# Patient Record
Sex: Female | Born: 1955 | Race: White | Hispanic: No | Marital: Single | Smoking: Former smoker
Health system: Southern US, Community
[De-identification: ages and names within clinical notes are randomized; demographics above are authoritative.]

## PROBLEM LIST (undated history)

## (undated) DIAGNOSIS — F32A Depression, unspecified: Secondary | ICD-10-CM

## (undated) DIAGNOSIS — F329 Major depressive disorder, single episode, unspecified: Secondary | ICD-10-CM

## (undated) DIAGNOSIS — C9210 Chronic myeloid leukemia, BCR/ABL-positive, not having achieved remission: Secondary | ICD-10-CM

## (undated) HISTORY — PX: ROTATOR CUFF REPAIR: SHX139

## (undated) HISTORY — PX: APPENDECTOMY: SHX54

## (undated) HISTORY — PX: CERVICAL SPINE SURGERY: SHX589

## (undated) HISTORY — PX: BREAST ENHANCEMENT SURGERY: SHX7

## (undated) HISTORY — PX: TOTAL SHOULDER REPLACEMENT: SUR1217

## (undated) HISTORY — PX: CARPAL TUNNEL RELEASE: SHX101

---

## 2011-07-16 DIAGNOSIS — I10 Essential (primary) hypertension: Secondary | ICD-10-CM | POA: Insufficient documentation

## 2011-07-16 DIAGNOSIS — K219 Gastro-esophageal reflux disease without esophagitis: Secondary | ICD-10-CM | POA: Insufficient documentation

## 2011-07-16 DIAGNOSIS — M549 Dorsalgia, unspecified: Secondary | ICD-10-CM | POA: Insufficient documentation

## 2011-07-23 DIAGNOSIS — F339 Major depressive disorder, recurrent, unspecified: Secondary | ICD-10-CM | POA: Insufficient documentation

## 2011-10-14 DIAGNOSIS — E039 Hypothyroidism, unspecified: Secondary | ICD-10-CM | POA: Insufficient documentation

## 2011-10-14 DIAGNOSIS — Z9884 Bariatric surgery status: Secondary | ICD-10-CM | POA: Insufficient documentation

## 2011-11-04 DIAGNOSIS — IMO0001 Reserved for inherently not codable concepts without codable children: Secondary | ICD-10-CM | POA: Insufficient documentation

## 2017-07-19 ENCOUNTER — Emergency Department: Payer: Federal, State, Local not specified - PPO

## 2017-07-19 ENCOUNTER — Other Ambulatory Visit: Payer: Self-pay

## 2017-07-19 DIAGNOSIS — K851 Biliary acute pancreatitis without necrosis or infection: Principal | ICD-10-CM | POA: Diagnosis present

## 2017-07-19 DIAGNOSIS — Z96619 Presence of unspecified artificial shoulder joint: Secondary | ICD-10-CM | POA: Diagnosis present

## 2017-07-19 DIAGNOSIS — R740 Nonspecific elevation of levels of transaminase and lactic acid dehydrogenase [LDH]: Secondary | ICD-10-CM | POA: Diagnosis present

## 2017-07-19 DIAGNOSIS — Z79899 Other long term (current) drug therapy: Secondary | ICD-10-CM

## 2017-07-19 DIAGNOSIS — K812 Acute cholecystitis with chronic cholecystitis: Secondary | ICD-10-CM | POA: Diagnosis present

## 2017-07-19 DIAGNOSIS — Z9884 Bariatric surgery status: Secondary | ICD-10-CM

## 2017-07-19 DIAGNOSIS — Z87891 Personal history of nicotine dependence: Secondary | ICD-10-CM

## 2017-07-19 DIAGNOSIS — Z856 Personal history of leukemia: Secondary | ICD-10-CM

## 2017-07-19 DIAGNOSIS — F329 Major depressive disorder, single episode, unspecified: Secondary | ICD-10-CM | POA: Diagnosis present

## 2017-07-19 DIAGNOSIS — E039 Hypothyroidism, unspecified: Secondary | ICD-10-CM | POA: Diagnosis present

## 2017-07-19 LAB — BASIC METABOLIC PANEL
ANION GAP: 11 (ref 5–15)
BUN: 10 mg/dL (ref 6–20)
CO2: 22 mmol/L (ref 22–32)
Calcium: 9.3 mg/dL (ref 8.9–10.3)
Chloride: 102 mmol/L (ref 98–111)
Creatinine, Ser: 0.83 mg/dL (ref 0.44–1.00)
Glucose, Bld: 215 mg/dL — ABNORMAL HIGH (ref 70–99)
POTASSIUM: 4.3 mmol/L (ref 3.5–5.1)
SODIUM: 135 mmol/L (ref 135–145)

## 2017-07-19 LAB — CBC
HEMATOCRIT: 40 % (ref 35.0–47.0)
HEMOGLOBIN: 14.2 g/dL (ref 12.0–16.0)
MCH: 30.7 pg (ref 26.0–34.0)
MCHC: 35.6 g/dL (ref 32.0–36.0)
MCV: 86.2 fL (ref 80.0–100.0)
Platelets: 224 10*3/uL (ref 150–440)
RBC: 4.64 MIL/uL (ref 3.80–5.20)
RDW: 13.1 % (ref 11.5–14.5)
WBC: 10 10*3/uL (ref 3.6–11.0)

## 2017-07-19 LAB — TROPONIN I

## 2017-07-19 NOTE — ED Triage Notes (Signed)
Patient reports having epigastric/chest pain since 2 pm today.  Patient also reports having dry heaves.  Patient currently takes a chemo pill daily.

## 2017-07-20 ENCOUNTER — Inpatient Hospital Stay
Admission: EM | Admit: 2017-07-20 | Discharge: 2017-07-22 | DRG: 418 | Disposition: A | Discharge status: Home / Self Care | Payer: Federal, State, Local not specified - PPO | Attending: Internal Medicine | Admitting: Internal Medicine

## 2017-07-20 ENCOUNTER — Emergency Department: Payer: Federal, State, Local not specified - PPO

## 2017-07-20 ENCOUNTER — Other Ambulatory Visit: Payer: Self-pay

## 2017-07-20 DIAGNOSIS — Z419 Encounter for procedure for purposes other than remedying health state, unspecified: Secondary | ICD-10-CM

## 2017-07-20 DIAGNOSIS — K851 Biliary acute pancreatitis without necrosis or infection: Secondary | ICD-10-CM | POA: Diagnosis present

## 2017-07-20 DIAGNOSIS — Z856 Personal history of leukemia: Secondary | ICD-10-CM | POA: Diagnosis not present

## 2017-07-20 DIAGNOSIS — Z79899 Other long term (current) drug therapy: Secondary | ICD-10-CM | POA: Diagnosis not present

## 2017-07-20 DIAGNOSIS — Z87891 Personal history of nicotine dependence: Secondary | ICD-10-CM | POA: Diagnosis not present

## 2017-07-20 DIAGNOSIS — F329 Major depressive disorder, single episode, unspecified: Secondary | ICD-10-CM | POA: Diagnosis present

## 2017-07-20 DIAGNOSIS — R109 Unspecified abdominal pain: Secondary | ICD-10-CM

## 2017-07-20 DIAGNOSIS — Z9884 Bariatric surgery status: Secondary | ICD-10-CM | POA: Diagnosis not present

## 2017-07-20 DIAGNOSIS — Z96619 Presence of unspecified artificial shoulder joint: Secondary | ICD-10-CM | POA: Diagnosis present

## 2017-07-20 DIAGNOSIS — K812 Acute cholecystitis with chronic cholecystitis: Secondary | ICD-10-CM | POA: Diagnosis present

## 2017-07-20 DIAGNOSIS — E039 Hypothyroidism, unspecified: Secondary | ICD-10-CM | POA: Diagnosis present

## 2017-07-20 DIAGNOSIS — R740 Nonspecific elevation of levels of transaminase and lactic acid dehydrogenase [LDH]: Secondary | ICD-10-CM | POA: Diagnosis present

## 2017-07-20 HISTORY — DX: Chronic myeloid leukemia, BCR/ABL-positive, not having achieved remission: C92.10

## 2017-07-20 HISTORY — DX: Depression, unspecified: F32.A

## 2017-07-20 HISTORY — DX: Major depressive disorder, single episode, unspecified: F32.9

## 2017-07-20 LAB — HEMOGLOBIN A1C
Hgb A1c MFr Bld: 5.1 % (ref 4.8–5.6)
Mean Plasma Glucose: 99.67 mg/dL

## 2017-07-20 LAB — COMPREHENSIVE METABOLIC PANEL
ALT: 66 U/L — AB (ref 0–44)
AST: 82 U/L — ABNORMAL HIGH (ref 15–41)
Albumin: 4 g/dL (ref 3.5–5.0)
Alkaline Phosphatase: 61 U/L (ref 38–126)
Anion gap: 6 (ref 5–15)
BUN: 9 mg/dL (ref 6–20)
CHLORIDE: 104 mmol/L (ref 98–111)
CO2: 28 mmol/L (ref 22–32)
CREATININE: 0.76 mg/dL (ref 0.44–1.00)
Calcium: 9.3 mg/dL (ref 8.9–10.3)
Glucose, Bld: 119 mg/dL — ABNORMAL HIGH (ref 70–99)
Potassium: 4.2 mmol/L (ref 3.5–5.1)
SODIUM: 138 mmol/L (ref 135–145)
Total Bilirubin: 1.1 mg/dL (ref 0.3–1.2)
Total Protein: 7 g/dL (ref 6.5–8.1)

## 2017-07-20 LAB — LIPASE, BLOOD
LIPASE: 161 U/L — AB (ref 11–51)
Lipase: 657 U/L — ABNORMAL HIGH (ref 11–51)

## 2017-07-20 LAB — HEPATIC FUNCTION PANEL
ALBUMIN: 4.2 g/dL (ref 3.5–5.0)
ALT: 77 U/L — ABNORMAL HIGH (ref 0–44)
AST: 138 U/L — ABNORMAL HIGH (ref 15–41)
Alkaline Phosphatase: 62 U/L (ref 38–126)
BILIRUBIN INDIRECT: 0.8 mg/dL (ref 0.3–0.9)
BILIRUBIN TOTAL: 1.3 mg/dL — AB (ref 0.3–1.2)
Bilirubin, Direct: 0.5 mg/dL — ABNORMAL HIGH (ref 0.0–0.2)
TOTAL PROTEIN: 7.1 g/dL (ref 6.5–8.1)

## 2017-07-20 LAB — ETHANOL: Alcohol, Ethyl (B): <10 mg/dL (ref <10)

## 2017-07-20 LAB — TSH: TSH: 0.564 u[IU]/mL (ref 0.350–4.500)

## 2017-07-20 MED ORDER — FAMOTIDINE 20 MG PO TABS
40.0000 mg | ORAL_TABLET | Freq: Every day | ORAL | Status: DC
  Administered 2017-07-20 – 2017-07-22 (×3): 40 mg via ORAL
  Filled 2017-07-20 (×3): qty 2

## 2017-07-20 MED ORDER — ONDANSETRON HCL 4 MG PO TABS: 4.0000 mg | ORAL_TABLET | Freq: Four times a day (QID) | ORAL | Status: DC | PRN

## 2017-07-20 MED ORDER — CYANOCOBALAMIN 1000 MCG/ML IJ SOLN
1000.0000 ug | INTRAMUSCULAR | Status: DC
  Administered 2017-07-20: 1000 ug via INTRAMUSCULAR
  Filled 2017-07-20: qty 1

## 2017-07-20 MED ORDER — ONDANSETRON HCL 4 MG/2ML IJ SOLN
4.0000 mg | Freq: Once | INTRAMUSCULAR | Status: AC
  Administered 2017-07-20: 4 mg via INTRAVENOUS
  Filled 2017-07-20: qty 2

## 2017-07-20 MED ORDER — CALCIUM CARBONATE ANTACID 500 MG PO CHEW
1250.0000 mg | CHEWABLE_TABLET | Freq: Three times a day (TID) | ORAL | Status: DC
  Administered 2017-07-20 – 2017-07-22 (×5): 1250 mg via ORAL
  Filled 2017-07-20 (×5): qty 7

## 2017-07-20 MED ORDER — MORPHINE SULFATE (PF) 2 MG/ML IV SOLN
2.0000 mg | INTRAVENOUS | Status: DC | PRN
  Administered 2017-07-20 – 2017-07-22 (×11): 2 mg via INTRAVENOUS
  Filled 2017-07-20 (×11): qty 1

## 2017-07-20 MED ORDER — BIOTIN 1000 MCG PO TABS: 1000.0000 ug | ORAL_TABLET | Freq: Three times a day (TID) | ORAL | Status: DC

## 2017-07-20 MED ORDER — MORPHINE SULFATE (PF) 4 MG/ML IV SOLN
4.0000 mg | INTRAVENOUS | Status: DC | PRN
  Administered 2017-07-20: 4 mg via INTRAVENOUS
  Filled 2017-07-20: qty 1

## 2017-07-20 MED ORDER — OXYCODONE HCL 5 MG PO TABS
10.0000 mg | ORAL_TABLET | ORAL | Status: DC | PRN
  Administered 2017-07-20 – 2017-07-22 (×7): 10 mg via ORAL
  Filled 2017-07-20 (×7): qty 2

## 2017-07-20 MED ORDER — FOLIC ACID 1 MG PO TABS
1.0000 mg | ORAL_TABLET | Freq: Every day | ORAL | Status: DC
  Administered 2017-07-20 – 2017-07-22 (×3): 1 mg via ORAL
  Filled 2017-07-20 (×3): qty 1

## 2017-07-20 MED ORDER — VARENICLINE TARTRATE 1 MG PO TABS
1.0000 mg | ORAL_TABLET | Freq: Two times a day (BID) | ORAL | Status: DC
  Administered 2017-07-21 – 2017-07-22 (×3): 1 mg via ORAL
  Filled 2017-07-20 (×6): qty 1

## 2017-07-20 MED ORDER — ESCITALOPRAM OXALATE 10 MG PO TABS
20.0000 mg | ORAL_TABLET | Freq: Every day | ORAL | Status: DC
  Administered 2017-07-20 – 2017-07-22 (×3): 20 mg via ORAL
  Filled 2017-07-20 (×3): qty 2

## 2017-07-20 MED ORDER — HEPARIN SODIUM (PORCINE) 5000 UNIT/ML IJ SOLN
5000.0000 [IU] | Freq: Three times a day (TID) | INTRAMUSCULAR | Status: DC
  Administered 2017-07-21 – 2017-07-22 (×3): 5000 [IU] via SUBCUTANEOUS
  Filled 2017-07-20 (×6): qty 1

## 2017-07-20 MED ORDER — ONDANSETRON HCL 4 MG/2ML IJ SOLN
4.0000 mg | Freq: Four times a day (QID) | INTRAMUSCULAR | Status: DC | PRN
  Administered 2017-07-20 – 2017-07-21 (×2): 4 mg via INTRAVENOUS
  Filled 2017-07-20: qty 2

## 2017-07-20 MED ORDER — DOCUSATE SODIUM 100 MG PO CAPS
100.0000 mg | ORAL_CAPSULE | Freq: Two times a day (BID) | ORAL | Status: DC
  Filled 2017-07-20 (×3): qty 1

## 2017-07-20 MED ORDER — MORPHINE SULFATE (PF) 4 MG/ML IV SOLN
4.0000 mg | Freq: Once | INTRAVENOUS | Status: AC
  Administered 2017-07-20: 4 mg via INTRAVENOUS
  Filled 2017-07-20: qty 1

## 2017-07-20 MED ORDER — MORPHINE SULFATE (PF) 2 MG/ML IV SOLN: 2.0000 mg | INTRAVENOUS | Status: DC | PRN

## 2017-07-20 MED ORDER — VITAMIN D 1000 UNITS PO TABS
1000.0000 [IU] | ORAL_TABLET | Freq: Every day | ORAL | Status: DC
  Administered 2017-07-20 – 2017-07-22 (×3): 1000 [IU] via ORAL
  Filled 2017-07-20 (×3): qty 1

## 2017-07-20 MED ORDER — SODIUM CHLORIDE 0.9 % IV SOLN
INTRAVENOUS | Status: DC
  Administered 2017-07-20 – 2017-07-22 (×6): via INTRAVENOUS

## 2017-07-20 MED ORDER — BOSUTINIB 100 MG PO TABS: 300.0000 mg | ORAL_TABLET | Freq: Every day | ORAL | Status: DC

## 2017-07-20 NOTE — Consult Note (Signed)
Vonda Antigua, MD [address] [place], [place], [place], [address] [id number], [place], [place], [place], [address] Phone: [phone]  Fax: [phone]  Consultation  Referring Provider:     Dr. Darvin Neighbours Primary Care Physician:  System, Pcp Not In Reason for Consultation:     Pancreatitis  Date of Admission:  07/20/2017 Date of Consultation:  07/20/2017         HPI:   Shelly Dunn is a 62 y.o. female who presents with abdominal pain that started yesterday around 2 PM.  Hervey Ard, with radiation to the back, intermittent.  Relieved with pain medications she received in the hospital.  Associated with nausea but no vomiting.  No previous history of abdominal pain similar to this.  Denies any alcohol use.  No previous history of pancreatitis. Denies any new medications.  Takes herbal supplements, however most of them are vitamins, such as vitamin D, vitamin B12.  Takes turmeric.  No weight loss supplements.  Lipase on admission was elevated to 657.  Bilirubin mildly elevated to 1.3.  Normal alk phos.  AST and ALT were mildly elevated as well.  Ultrasound showed distended gallbladder with a few gallstones noted to the neck.  Trace fluid in the gallbladder fossa.  Common bile duct 7 mm which was reported to be normal for age.  Past Medical History:  Diagnosis Date  . CML (chronic myelocytic leukemia) ([facility])   . Depression     Past Surgical History:  Procedure Laterality Date  . APPENDECTOMY    . BREAST ENHANCEMENT SURGERY    . CARPAL TUNNEL RELEASE    . CERVICAL SPINE SURGERY    . CESAREAN SECTION    . ROTATOR CUFF REPAIR    . TOTAL SHOULDER REPLACEMENT      Prior to Admission medications   Medication Sig Start Date End Date Taking? Authorizing Provider  Biotin 1000 MCG tablet Take 1,000 mcg by mouth 3 (three) times daily.   Yes [provider]  BOSULIF 100 MG tablet Take 300 mg by mouth daily. 07/11/17  Yes [provider]  calcium carbonate (OS-CAL  - DOSED IN MG OF ELEMENTAL CALCIUM) 1250 (500 Ca) MG tablet Take 1 tablet by mouth.   Yes [provider]  CHANTIX 1 MG tablet Take 1 mg by mouth 2 (two) times daily. 07/12/17  Yes [provider]  cholecalciferol (VITAMIN D) 1000 units tablet Take 1,000 Units by mouth daily.   Yes [provider]  cyanocobalamin (,VITAMIN B-12,) 1000 MCG/ML injection Inject 1 mL into the muscle every 30 (thirty) days. 07/12/17  Yes [provider]  escitalopram (LEXAPRO) 20 MG tablet Take 20 mg by mouth daily. 06/23/17  Yes [provider]  famotidine (PEPCID) 40 MG tablet Take 40 mg by mouth daily.  07/12/17  Yes [provider]  Flaxseed, Linseed, (FLAX SEEDS PO) Take 1 capsule by mouth daily.   Yes [provider]  folic acid (FOLVITE) 1 MG tablet Take 1 mg by mouth daily.   Yes [provider]  Oxycodone HCl 10 MG TABS Take 10 mg by mouth every 4 (four) hours as needed for pain. 06/19/17  Yes [provider]  TURMERIC PO Take 1 capsule by mouth daily.   Yes [provider]    Family History  Problem Relation Age of Onset  . Gallbladder disease Mother      Social History   Tobacco Use  . Smoking status: Former Research scientist (life sciences)  . Smokeless tobacco:  Never Used  Substance Use Topics  . Alcohol use: Yes  . Drug use: Never    Allergies as of 07/19/2017  . (No Known Allergies)    Review of Systems:    All systems reviewed and negative except where noted in HPI.   Physical Exam:  Vital signs in last 24 hours: Vitals:   07/20/17 0414 07/20/17 0430 07/20/17 0515 07/20/17 1218  BP: (!) 155/79 (!) 160/69 139/63 (!) 123/54  Pulse: 65 65 63 64  Resp: 18  18 18   Temp:   98.3 F (36.8 C) 98.5 F (36.9 C)  TempSrc:   Oral Oral  SpO2: 100% 95% 98% 94%  Weight:      Height:       Last BM Date: 07/19/17 General:   Pleasant, cooperative in NAD Head:  Normocephalic and atraumatic. Eyes:   No icterus.   Conjunctiva pink.  PERRLA. Ears:  Normal auditory acuity. Neck:  Supple; no masses or thyroidomegaly Lungs: Respirations even and unlabored. Lungs clear to auscultation bilaterally.   No wheezes, crackles, or rhonchi.  Abdomen:  Soft, nondistended, nontender. Normal bowel sounds. No appreciable masses or hepatomegaly.  No rebound or guarding.  Neurologic:  Alert and oriented x3;  grossly normal neurologically. Skin:  Intact without significant lesions or rashes. Cervical Nodes:  No significant cervical adenopathy. Psych:  Alert and cooperative. Normal affect.  LAB RESULTS: Recent Labs    07/19/17 2304  WBC 10.0  HGB 14.2  HCT 40.0  PLT 224   BMET Recent Labs    07/19/17 2304 07/20/17 0527  NA 135 138  K 4.3 4.2  CL 102 104  CO2 22 28  GLUCOSE 215* 119*  BUN 10 9  CREATININE 0.83 0.76  CALCIUM 9.3 9.3   LFT Recent Labs    07/19/17 2304 07/20/17 0527  PROT 7.1 7.0  ALBUMIN 4.2 4.0  AST 138* 82*  ALT 77* 66*  ALKPHOS 62 61  BILITOT 1.3* 1.1  BILIDIR 0.5*  --   IBILI 0.8  --    PT/INR No results for input(s): LABPROT, INR in the last 72 hours.  STUDIES: Dg Chest 2 View  Result Date: 07/19/2017 CLINICAL DATA:  Epigastric and chest pain since 2 p.m. today. EXAM: CHEST - 2 VIEW COMPARISON:  None. FINDINGS: Emphysematous hyperinflation of the lungs, upper lobe predominant without acute pulmonary consolidation, effusion or pneumothorax. Heart and mediastinal contours are normal with aortic atherosclerosis at the arch noted. ACDF of the included lower cervical spine. Surgical clip projects over the left humeral head with tunneled tendon fixation device projecting over the proximal right humeral diaphysis. Numerous surgical clips are seen in the upper abdomen. IMPRESSION: Emphysematous hyperinflation of the lungs, upper lobe predominant. No active pulmonary disease. Aortic atherosclerosis. Electronically Signed   By: Ashley Royalty M.D.   On: 07/19/2017 23:32   US Abdomen Limited Ruq  Result  Date: 07/20/2017 CLINICAL DATA:  Abdominal pain EXAM: ULTRASOUND ABDOMEN LIMITED RIGHT UPPER QUADRANT COMPARISON:  None. FINDINGS: Gallbladder: A few small gallstones measuring up to 7 mm are identified near the neck of the gallbladder. There is physiologic distention of the gallbladder without mural thickening. Trace fluid in the gallbladder fossa abutting the gallbladder is noted which can be a normal finding. Excluding the fluid, the gallbladder wall is not thickened. Where the gallbladder wall was measured includes this fluid and therefore is artificially thickened. No sonographic Murphy sign noted by sonographer. Common bile duct: Diameter: 7 mm, within normal limits for age.  Liver: No focal lesion identified. Within normal limits in parenchymal echogenicity. Portal vein is patent on color Doppler imaging with normal direction of blood flow towards the liver. IMPRESSION: Physiologically distended gallbladder with a few gallstones noted near the neck. Trace fluid in the gallbladder fossa is seen which can be a normal finding. There is no sonographic Murphy's. Findings are equivocal for acute cholecystitis. HIDA scan if clinically necessary. Electronically Signed   By: Ashley Royalty M.D.   On: 07/20/2017 01:55      Impression / Plan:   Shelly Dunn is a 62 y.o. y/o female with abdominal pain that started yesterday, admitted with pancreatitis  Likely source of pancreatitis is passed gallstones Normalization of bili today, and ultrasound showing a normal common bile bile duct, is consistent with no stones in the CBD at this time, and no indication for ERCP Abdominal pain is also improving, which is also consistent with passed gallstones  Would recommend checking triglycerides with next labs to rule out hypertriglyceridemia as source of pancreatitis as well.  Patient will need cholecystectomy to prevent future episodes, and timing of this is deferred to surgery.  Okay to start clear liquid diet  today if okay with surgery and primary team.  Advance diet to low-fat diet tomorrow if patient tolerates clear liquids well.  Continue IV fluids, LR at 200 to 250 cc an hour for the first 2 days post admission, and then decrease as patient tolerates oral diet.  Continue pain control  Thank you for involving me in the care of this patient.      LOS: 0 days   Virgel Manifold, MD  07/20/2017, 3:32 PM

## 2017-07-20 NOTE — Consult Note (Signed)
SURGICAL CONSULTATION NOTE   HISTORY OF PRESENT ILLNESS (HPI):  62 y.o. female presented to [facility] ED for evaluation of chest pain. Patient reports started with significant chest pain 2 days ago. Refers the pain radiates to the abdomen. No pain radiation to the back. She refers had nausea but no vomiting. Denies fever or chills.   At the ER patient had labs and ultrasound. The images shows stones in the gallbladder without significant changes of cholecystitis. The labs shows elevated lipase of 657 with mild elevation of bilirubin to 1.3.  Today at the room patient refers she still had pain this morning. She just received Morphine and refers it starting to work and improve the pain. Still NPO. Denies nausea.   Surgery is consulted by Dr. Despina Arias in this context for evaluation and management of gallstone pancreatitis.  PAST MEDICAL HISTORY (PMH):  Past Medical History:  Diagnosis Date  . CML (chronic myelocytic leukemia) ([facility])   . Depression      PAST SURGICAL HISTORY ([place]):  Past Surgical History:  Procedure Laterality Date  . APPENDECTOMY    . BREAST ENHANCEMENT SURGERY    . CARPAL TUNNEL RELEASE    . CERVICAL SPINE SURGERY    . CESAREAN SECTION    . ROTATOR CUFF REPAIR    . TOTAL SHOULDER REPLACEMENT       MEDICATIONS:  Prior to Admission medications   Medication Sig Start Date End Date Taking? Authorizing Provider  Biotin 1000 MCG tablet Take 1,000 mcg by mouth 3 (three) times daily.   Yes [provider]  BOSULIF 100 MG tablet Take 300 mg by mouth daily. 07/11/17  Yes [provider]  calcium carbonate (OS-CAL - DOSED IN MG OF ELEMENTAL CALCIUM) 1250 (500 Ca) MG tablet Take 1 tablet by mouth.   Yes [provider]  CHANTIX 1 MG tablet Take 1 mg by mouth 2 (two) times daily. 07/12/17  Yes [provider]  cholecalciferol (VITAMIN D) 1000 units tablet Take 1,000 Units by mouth daily.   Yes [provider]  cyanocobalamin (,VITAMIN  B-12,) 1000 MCG/ML injection Inject 1 mL into the muscle every 30 (thirty) days. 07/12/17  Yes [provider]  escitalopram (LEXAPRO) 20 MG tablet Take 20 mg by mouth daily. 06/23/17  Yes [provider]  famotidine (PEPCID) 40 MG tablet Take 40 mg by mouth daily.  07/12/17  Yes [provider]  Flaxseed, Linseed, (FLAX SEEDS PO) Take 1 capsule by mouth daily.   Yes [provider]  folic acid (FOLVITE) 1 MG tablet Take 1 mg by mouth daily.   Yes [provider]  Oxycodone HCl 10 MG TABS Take 10 mg by mouth every 4 (four) hours as needed for pain. 06/19/17  Yes [provider]  TURMERIC PO Take 1 capsule by mouth daily.   Yes [provider]     ALLERGIES:  No Known Allergies   SOCIAL HISTORY:  Social History   Socioeconomic History  . Marital status: Single    Spouse name: Not on file  . Number of children: Not on file  . Years of education: Not on file  . Highest education level: Not on file  Occupational History  . Not on file  Social Needs  . Financial resource strain: Not on file  . Food insecurity:    Worry: Not on file    Inability: Not on file  . Transportation needs:    Medical: Not on file    Non-medical: Not  on file  Tobacco Use  . Smoking status: Former Research scientist (life sciences)  . Smokeless tobacco: Never Used  Substance and Sexual Activity  . Alcohol use: Yes  . Drug use: Never  . Sexual activity: Not on file  Lifestyle  . Physical activity:    Days per week: Not on file    Minutes per session: Not on file  . Stress: Not on file  Relationships  . Social connections:    Talks on phone: Not on file    Gets together: Not on file    Attends religious service: Not on file    Active member of club or organization: Not on file    Attends meetings of clubs or organizations: Not on file    Relationship status: Not on file  . Intimate partner violence:    Fear of current or ex partner: Not on file    Emotionally abused:  Not on file    Physically abused: Not on file    Forced sexual activity: Not on file  Other Topics Concern  . Not on file  Social History Narrative  . Not on file    The patient currently resides (home / rehab facility / nursing home): Home The patient normally is (ambulatory / bedbound): Ambulatory   FAMILY HISTORY:  Family History  Problem Relation Age of Onset  . Gallbladder disease Mother      REVIEW OF SYSTEMS:  Constitutional: denies weight loss, fever, chills, or sweats  Eyes: denies any other vision changes, history of eye injury  ENT: denies sore throat, hearing problems  Respiratory: denies shortness of breath, wheezing  Cardiovascular: denies chest pain, palpitations  Gastrointestinal:  Positive for abdominal pain, Positive for nausea but no vomiting. See HPI Genitourinary: denies burning with urination or urinary frequency Musculoskeletal: denies any other joint pains or cramps  Skin: denies any other rashes or skin discolorations  Neurological: denies any other headache, dizziness, weakness  Psychiatric: denies any other depression, anxiety   All other review of systems were negative   VITAL SIGNS:  Temp:  [98.3 F (36.8 C)] 98.3 F (36.8 C) (06/30 0515) Pulse Rate:  [63-68] 63 (06/30 0515) Resp:  [16-18] 18 (06/30 0515) BP: (139-160)/(63-79) 139/63 (06/30 0515) SpO2:  [95 %-100 %] 98 % (06/30 0515) Weight:  [59 kg (130 lb)] 59 kg (130 lb) (06/29 2258)     Height: 5\' 4"  (162.6 cm) Weight: 59 kg (130 lb) BMI (Calculated): 22.3   PHYSICAL EXAM:  Constitutional:  -- Normal body habitus  -- Awake, alert, and oriented x3  Eyes:  -- Pupils equally round and reactive to light  -- No scleral icterus  Ear, nose, and throat:  -- No jugular venous distension  Pulmonary:  -- No crackles  -- Equal breath sounds bilaterally -- Breathing non-labored at rest Cardiovascular:  -- S1, S2 present  -- No pericardial rubs Gastrointestinal:  -- Abdomen soft, mild  tender on epigastric, non-distended, no guarding or rebound tenderness -- No abdominal masses appreciated, pulsatile or otherwise  Musculoskeletal and Integumentary:  -- Wounds or skin discoloration: None appreciated -- Extremities: no edema  Neurologic:  -- Motor function: intact and symmetric -- Sensation: intact and symmetric   Labs:  CBC Latest Ref Rng & Units 07/19/2017  WBC 3.6 - 11.0 K/uL 10.0  Hemoglobin 12.0 - 16.0 g/dL 14.2  Hematocrit 35.0 - 47.0 % 40.0  Platelets 150 - 440 K/uL 224   CMP Latest Ref Rng & Units 07/20/2017 07/19/2017  Glucose 70 -  99 mg/dL 119(H) 215(H)  BUN 6 - 20 mg/dL 9 10  Creatinine 0.44 - 1.00 mg/dL 0.76 0.83  Sodium 135 - 145 mmol/L 138 135  Potassium 3.5 - 5.1 mmol/L 4.2 4.3  Chloride 98 - 111 mmol/L 104 102  CO2 22 - 32 mmol/L 28 22  Calcium 8.9 - 10.3 mg/dL 9.3 9.3  Total Protein 6.5 - 8.1 g/dL 7.0 7.1  Total Bilirubin 0.3 - 1.2 mg/dL 1.1 1.3(H)  Alkaline Phos 38 - 126 U/L 61 62  AST 15 - 41 U/L 82(H) 138(H)  ALT 0 - 44 U/L 66(H) 77(H)   Lipase: 657 (07/19/17) Lipase: 161 (07/20/17)  Imaging studies:  EXAM: ULTRASOUND ABDOMEN LIMITED RIGHT UPPER QUADRANT  COMPARISON:  None.  FINDINGS: Gallbladder:  A few small gallstones measuring up to 7 mm are identified near the neck of the gallbladder. There is physiologic distention of the gallbladder without mural thickening. Trace fluid in the gallbladder fossa abutting the gallbladder is noted which can be a normal finding. Excluding the fluid, the gallbladder wall is not thickened. Where the gallbladder wall was measured includes this fluid and therefore is artificially thickened. No sonographic Murphy sign noted by sonographer.  Common bile duct:  Diameter: 7 mm, within normal limits for age.  Liver:  No focal lesion identified. Within normal limits in parenchymal echogenicity. Portal vein is patent on color Doppler imaging with normal direction of blood flow towards the  liver.  IMPRESSION: Physiologically distended gallbladder with a few gallstones noted near the neck. Trace fluid in the gallbladder fossa is seen which can be a normal finding. There is no sonographic Murphy's. Findings are equivocal for acute cholecystitis. HIDA scan if clinically necessary.  Electronically Signed   By: Ashley Royalty M.D.   On: 07/20/2017 01:55   Assessment/Plan:  62 y.o. female with gallstone pancreatitis. On labs it looks like the stone is passing with significant improvement of lipase. Patient with improved abdominal pain after Morphine. If patient continue with improving abdominal pain may start clear liquid. Will recommend cholecystectomy before discharge after normalization of lipase and abdominal pain.   Arnold Long, MD

## 2017-07-20 NOTE — H&P (Signed)
Shelly Dunn is an 62 y.o. female.   Chief Complaint: Chest pain HPI: The patient with past medical history of CML presents to the emergency department complaining of chest pain.  The patient locates the pain mid epigastric area and reports that it radiates into her chest.  She admits to mild nausea but no vomiting.  E[facility] evaluation revealed elevated lipase.  Ultrasound of the patient's right upper quadrant did not confirm a ductal stone but due to the patient's ongoing pain as well as transaminitis the emergency department staff called the hospitalist service for admission.  Past Medical History:  Diagnosis Date  . CML (chronic myelocytic leukemia) ([place])   . Depression     Past Surgical History:  Procedure Laterality Date  . TOTAL SHOULDER REPLACEMENT      Family History  Problem Relation Age of Onset  . Gallbladder disease Mother    Social History:  reports that she has quit smoking. She has never used smokeless tobacco. Her alcohol and drug histories are not on file.  Allergies: No Known Allergies  Medications Prior to Admission  Medication Sig Dispense Refill  . Biotin 1000 MCG tablet Take 1,000 mcg by mouth 3 (three) times daily.    Marland Kitchen BOSULIF 100 MG tablet Take 300 mg by mouth daily.    . calcium carbonate (OS-CAL - DOSED IN MG OF ELEMENTAL CALCIUM) 1250 (500 Ca) MG tablet Take 1 tablet by mouth.    . CHANTIX 1 MG tablet Take 1 mg by mouth 2 (two) times daily.  5  . cholecalciferol (VITAMIN D) 1000 units tablet Take 1,000 Units by mouth daily.    . cyanocobalamin (,VITAMIN B-12,) 1000 MCG/ML injection Inject 1 mL into the muscle every 30 (thirty) days.  5  . escitalopram (LEXAPRO) 20 MG tablet Take 20 mg by mouth daily.  2  . famotidine (PEPCID) 40 MG tablet Take 40 mg by mouth daily.   5  . Flaxseed, Linseed, (FLAX SEEDS PO) Take 1 capsule by mouth daily.    . folic acid (FOLVITE) 1 MG tablet Take 1 mg by mouth daily.    . Oxycodone HCl 10 MG TABS Take 10 mg by mouth  every 4 (four) hours as needed for pain.  0  . TURMERIC PO Take 1 capsule by mouth daily.      Results for orders placed or performed during the hospital encounter of 07/20/17 (from the past 48 hour(s))  Basic metabolic panel     Status: Abnormal   Collection Time: 07/19/17 11:04 PM  Result Value Ref Range   Sodium 135 135 - 145 mmol/L   Potassium 4.3 3.5 - 5.1 mmol/L   Chloride 102 98 - 111 mmol/L    Comment: Please note change in reference range.   CO2 22 22 - 32 mmol/L   Glucose, Bld 215 (H) 70 - 99 mg/dL    Comment: Please note change in reference range.   BUN 10 6 - 20 mg/dL    Comment: Please note change in reference range.   Creatinine, Ser 0.83 0.44 - 1.00 mg/dL   Calcium 9.3 8.9 - 10.3 mg/dL   GFR calc non Af Amer >60 >60 mL/min   GFR calc Af Amer >60 >60 mL/min    Comment: (NOTE) The eGFR has been calculated using the CKD EPI equation. This calculation has not been validated in all clinical situations. eGFR's persistently <60 mL/min signify possible Chronic Kidney Disease.    Anion gap 11 5 - 15  Comment: Performed at [facility], [address] [place], [place] [address]  CBC     Status: None   Collection Time: 07/19/17 11:04 PM  Result Value Ref Range   WBC 10.0 3.6 - 11.0 K/uL   RBC 4.64 3.80 - 5.20 MIL/uL   Hemoglobin 14.2 12.0 - 16.0 g/dL   HCT 40.0 35.0 - 47.0 %   MCV 86.2 80.0 - 100.0 fL   MCH 30.7 26.0 - 34.0 pg   MCHC 35.6 32.0 - 36.0 g/dL   RDW 13.1 11.5 - 14.5 %   Platelets 224 150 - 440 K/uL    Comment: Performed at [facility], [address] [place], [place] [address]  Troponin I     Status: None   Collection Time: 07/19/17 11:04 PM  Result Value Ref Range   Troponin I <0.03 <0.03 ng/mL    Comment: Performed at [facility], [address] [place], [place] [address]  Lipase, blood     Status: Abnormal   Collection Time: 07/19/17 11:04 PM  Result Value Ref Range   Lipase 657 (H) 11 - 51 U/L    Comment:  RESULT CONFIRMED BY MANUAL DILUTION.PMH Performed at [facility], [address] [place], [place] [address]   Hepatic function panel     Status: Abnormal   Collection Time: 07/19/17 11:04 PM  Result Value Ref Range   Total Protein 7.1 6.5 - 8.1 g/dL   Albumin 4.2 3.5 - 5.0 g/dL   AST 138 (H) 15 - 41 U/L   ALT 77 (H) 0 - 44 U/L    Comment: Please note change in reference range.   Alkaline Phosphatase 62 38 - 126 U/L   Total Bilirubin 1.3 (H) 0.3 - 1.2 mg/dL   Bilirubin, Direct 0.5 (H) 0.0 - 0.2 mg/dL    Comment: Please note change in reference range.   Indirect Bilirubin 0.8 0.3 - 0.9 mg/dL    Comment: Performed at [facility], [address] [place], [place] [address]  TSH     Status: None   Collection Time: 07/20/17  5:27 AM  Result Value Ref Range   TSH 0.564 0.350 - 4.500 uIU/mL    Comment: Performed by a 3rd Generation assay with a functional sensitivity of <=0.01 uIU/mL. Performed at [facility], [address] [place], [place] [address]   Ethanol     Status: None   Collection Time: 07/20/17  5:27 AM  Result Value Ref Range   Alcohol, Ethyl (B) <10 <10 mg/dL    Comment: (NOTE) Lowest detectable limit for serum alcohol is 10 mg/dL. For medical purposes only. Performed at [facility], [address] [place], [place] [address]    Dg Chest 2 View  Result Date: 07/19/2017 CLINICAL DATA:  Epigastric and chest pain since 2 p.m. today. EXAM: CHEST - 2 VIEW COMPARISON:  None. FINDINGS: Emphysematous hyperinflation of the lungs, upper lobe predominant without acute pulmonary consolidation, effusion or pneumothorax. Heart and mediastinal contours are normal with aortic atherosclerosis at the arch noted. ACDF of the included lower cervical spine. Surgical clip projects over the left humeral head with tunneled tendon fixation device projecting over the proximal right humeral diaphysis. Numerous surgical clips are seen in the upper abdomen.  IMPRESSION: Emphysematous hyperinflation of the lungs, upper lobe predominant. No active pulmonary disease. Aortic atherosclerosis. Electronically Signed   By: Ashley Royalty M.D.   On: 07/19/2017 23:32   US Abdomen Limited Ruq  Result  Date: 07/20/2017 CLINICAL DATA:  Abdominal pain EXAM: ULTRASOUND ABDOMEN LIMITED RIGHT UPPER QUADRANT COMPARISON:  None. FINDINGS: Gallbladder: A few small gallstones measuring up to 7 mm are identified near the neck of the gallbladder. There is physiologic distention of the gallbladder without mural thickening. Trace fluid in the gallbladder fossa abutting the gallbladder is noted which can be a normal finding. Excluding the fluid, the gallbladder wall is not thickened. Where the gallbladder wall was measured includes this fluid and therefore is artificially thickened. No sonographic Murphy sign noted by sonographer. Common bile duct: Diameter: 7 mm, within normal limits for age. Liver: No focal lesion identified. Within normal limits in parenchymal echogenicity. Portal vein is patent on color Doppler imaging with normal direction of blood flow towards the liver. IMPRESSION: Physiologically distended gallbladder with a few gallstones noted near the neck. Trace fluid in the gallbladder fossa is seen which can be a normal finding. There is no sonographic Murphy's. Findings are equivocal for acute cholecystitis. HIDA scan if clinically necessary. Electronically Signed   By: Ashley Royalty M.D.   On: 07/20/2017 01:55    Review of Systems  Constitutional: Negative for chills and fever.  HENT: Negative for sore throat and tinnitus.   Eyes: Negative for blurred vision and redness.  Respiratory: Negative for cough and shortness of breath.   Cardiovascular: Negative for chest pain, palpitations, orthopnea and PND.  Gastrointestinal: Positive for abdominal pain. Negative for diarrhea, nausea and vomiting.  Genitourinary: Negative for dysuria, frequency and urgency.  Musculoskeletal:  Negative for joint pain and myalgias.  Skin: Negative for rash.       No lesions  Neurological: Negative for speech change, focal weakness and weakness.  Endo/Heme/Allergies: Does not bruise/bleed easily.       No temperature intolerance  Psychiatric/Behavioral: Negative for depression and suicidal ideas.    Blood pressure 139/63, pulse 63, temperature 98.3 F (36.8 C), temperature source Oral, resp. rate 18, height 5' 4"  (1.626 m), weight 59 kg (130 lb), SpO2 98 %. Physical Exam  Vitals reviewed. Constitutional: She is oriented to person, place, and time. She appears well-developed and well-nourished. No distress.  HENT:  Head: Normocephalic and atraumatic.  Mouth/Throat: Oropharynx is clear and moist.  Eyes: Pupils are equal, round, and reactive to light. Conjunctivae and EOM are normal. No scleral icterus.  Neck: Normal range of motion. Neck supple. No JVD present. No tracheal deviation present. No thyromegaly present.  Cardiovascular: Normal rate, regular rhythm and normal heart sounds. Exam reveals no gallop and no friction rub.  No murmur heard. Respiratory: Effort normal and breath sounds normal.  GI: Soft. Bowel sounds are normal. She exhibits no distension. There is no tenderness.  Genitourinary:  Genitourinary Comments: Deferred  Musculoskeletal: Normal range of motion. She exhibits no edema.  Lymphadenopathy:    She has no cervical adenopathy.  Neurological: She is alert and oriented to person, place, and time. No cranial nerve deficit. She exhibits normal muscle tone.  Skin: Skin is warm and dry. No rash noted. No erythema.  Psychiatric: She has a normal mood and affect. Her behavior is normal. Judgment and thought content normal.     Assessment/Plan This is a 62 year old female admitted for pancreatitis. 1.  Pancreatitis: Etiology likely gallstone.  Need ERCP to confirm.  Consult gastroenterology.  Manage severe pain with IV morphine. 2.  Transaminitis: Hydrate with  intravenous fluid.  Check ethanol level 3.  CML: Cell lines normal for now.  Continue bosutinib Exline 4.  Depression: Stable; continue  Lexapro 5.  Hypothyroidism: Check TSH; continue Synthroid 6.  Tobacco abuse: Continue Chantix 7.  DVT prophylaxis: Heparin 8.  GI prophylaxis: Pepcid per home regimen  Harrie Foreman, MD 07/20/2017, 6:47 AM

## 2017-07-20 NOTE — Progress Notes (Signed)

## 2017-07-20 NOTE — ED Notes (Signed)
Pt easily awakened for VS; waiting patiently for treatment room; updated on wait time

## 2017-07-20 NOTE — ED Notes (Signed)
ED Provider at bedside. 

## 2017-07-20 NOTE — ED Notes (Signed)
Pt resting in sub wait recliner with eyes closed and even unlabored respirations

## 2017-07-20 NOTE — ED Notes (Signed)
Pt easily awakened; informed of ordered US;

## 2017-07-20 NOTE — ED Provider Notes (Signed)
[facility] Emergency Department Provider Note   ____________________________________________   First MD Initiated Contact with Patient 07/20/17 [phone]     (approximate)  I have reviewed the triage vital signs and the nursing notes.   HISTORY  Chief Complaint Chest Pain    HPI Shelly Dunn is a 62 y.o. female who comes into the hospital today with some abdominal pain.  The patient states that she has some pain below her breasts and across her upper abdomen that is been going on since about 2 PM.  The patient has a history of chronic myeloid leukemia.  She takes oxycodone every 4 hours but reports that it did not even help her pain.  She is had some nausea and vomiting and states that she would vomit of the medicines.  The patient denies any fevers.  She states that her chest and her stomach is burning.  She is had some shortness of breath and some dizziness.  She had a similar episode 7 years ago but was told that it was due to spasms of her esophagus.  The patient states though that that episode was not this bad.  She rates her pain a 7 out of 10 in intensity currently.  She is here today for evaluation.  Past Medical History:  Diagnosis Date  . CML (chronic myelocytic leukemia) ([place])   . Depression     Patient Active Problem List   Diagnosis Date Noted  . Gallstone pancreatitis 07/20/2017    Past Surgical History:  Procedure Laterality Date  . APPENDECTOMY    . BREAST ENHANCEMENT SURGERY    . CARPAL TUNNEL RELEASE    . CERVICAL SPINE SURGERY    . CESAREAN SECTION    . ROTATOR CUFF REPAIR    . TOTAL SHOULDER REPLACEMENT      Prior to Admission medications   Medication Sig Start Date End Date Taking? Authorizing Provider  Biotin 1000 MCG tablet Take 1,000 mcg by mouth 3 (three) times daily.   Yes [provider]  BOSULIF 100 MG tablet Take 300 mg by mouth daily. 07/11/17  Yes [provider]  calcium carbonate (OS-CAL - DOSED  IN MG OF ELEMENTAL CALCIUM) 1250 (500 Ca) MG tablet Take 1 tablet by mouth.   Yes [provider]  CHANTIX 1 MG tablet Take 1 mg by mouth 2 (two) times daily. 07/12/17  Yes [provider]  cholecalciferol (VITAMIN D) 1000 units tablet Take 1,000 Units by mouth daily.   Yes [provider]  cyanocobalamin (,VITAMIN B-12,) 1000 MCG/ML injection Inject 1 mL into the muscle every 30 (thirty) days. 07/12/17  Yes [provider]  escitalopram (LEXAPRO) 20 MG tablet Take 20 mg by mouth daily. 06/23/17  Yes [provider]  famotidine (PEPCID) 40 MG tablet Take 40 mg by mouth daily.  07/12/17  Yes [provider]  Flaxseed, Linseed, (FLAX SEEDS PO) Take 1 capsule by mouth daily.   Yes [provider]  folic acid (FOLVITE) 1 MG tablet Take 1 mg by mouth daily.   Yes [provider]  Oxycodone HCl 10 MG TABS Take 10 mg by mouth every 4 (four) hours as needed for pain. 06/19/17  Yes [provider]  TURMERIC PO Take 1 capsule by mouth daily.   Yes [provider]    Allergies Patient has no known allergies.  Family History  Problem Relation Age of Onset  . Gallbladder disease Mother     Social History Social  History   Tobacco Use  . Smoking status: Former Research scientist (life sciences)  . Smokeless tobacco: Never Used  Substance Use Topics  . Alcohol use: Yes  . Drug use: Never    Review of Systems  Constitutional: No fever/chills Eyes: No visual changes. ENT: No sore throat. Cardiovascular:  chest pain. Respiratory: shortness of breath. Gastrointestinal:  abdominal pain, nausea, vomiting.  No diarrhea.  No constipation. Genitourinary: Negative for dysuria. Musculoskeletal: Negative for back pain. Skin: Negative for rash. Neurological: Negative for headaches, focal weakness or numbness.   ____________________________________________   PHYSICAL EXAM:  VITAL SIGNS: ED Triage Vitals  Enc Vitals Group     BP 07/20/17  0300 (!) 149/70     Pulse Rate 07/20/17 0300 68     Resp 07/20/17 0300 16     Temp --      Temp src --      SpO2 07/20/17 0300 98 %     Weight 07/19/17 2258 130 lb (59 kg)     Height 07/19/17 2258 5\' 4"  (1.626 m)     Head Circumference --      Peak Flow --      Pain Score 07/20/17 0300 6     Pain Loc --      Pain Edu? --      Excl. in [place]? --     Constitutional: Alert and oriented. Well appearing and in moderate distress. Eyes: Conjunctivae are normal. PERRL. EOMI. Head: Atraumatic. Nose: No congestion/rhinnorhea. Mouth/Throat: Mucous membranes are moist.  Oropharynx non-erythematous. Cardiovascular: Normal rate, regular rhythm. Grossly normal heart sounds.  Good peripheral circulation. Respiratory: Normal respiratory effort.  No retractions. Lungs CTAB. Gastrointestinal: Soft with significant epigastric pain to palpation. No distention. Positive bowel sounds Musculoskeletal: No lower extremity tenderness nor edema.   Neurologic:  Normal speech and language.  Skin:  Skin is warm, dry and intact.  Psychiatric: Mood and affect are normal. Speech and behavior are normal.  ____________________________________________   LABS (all labs ordered are listed, but only abnormal results are displayed)  Labs Reviewed  BASIC METABOLIC PANEL - Abnormal; Notable for the following components:      Result Value   Glucose, Bld 215 (*)    All other components within normal limits  LIPASE, BLOOD - Abnormal; Notable for the following components:   Lipase 657 (*)    All other components within normal limits  HEPATIC FUNCTION PANEL - Abnormal; Notable for the following components:   AST 138 (*)    ALT 77 (*)    Total Bilirubin 1.3 (*)    Bilirubin, Direct 0.5 (*)    All other components within normal limits  CBC  TROPONIN I  TSH  HEMOGLOBIN A1C  ETHANOL  LIPASE, BLOOD  COMPREHENSIVE METABOLIC PANEL   ____________________________________________  EKG  ED ECG REPORT I, Loney Hering, the attending physician, personally viewed and interpreted this ECG.   Date: 07/19/2017  EKG Time: 2255  Rate: 62  Rhythm: normal sinus rhythm  Axis: normal  Intervals:none  ST&T Change: none  ____________________________________________  RADIOLOGY  ED MD interpretation: CXR: Emphysematous hyperinflation of the lungs, upper lobe predominant, no active pulmonary disease, aortic atherosclerosis.  Ultrasound abdomen right upper quadrant: Physiologically distended gallbladder with few gallstones noted near the neck, trace fluid in the gallbladder fossa can be normal finding.  There is no sonographic Murphy's, findings equivocal for acute cholecystitis  Official radiology report(s): Dg Chest 2 View  Result Date: 07/19/2017 CLINICAL DATA:  Epigastric and chest  pain since 2 p.m. today. EXAM: CHEST - 2 VIEW COMPARISON:  None. FINDINGS: Emphysematous hyperinflation of the lungs, upper lobe predominant without acute pulmonary consolidation, effusion or pneumothorax. Heart and mediastinal contours are normal with aortic atherosclerosis at the arch noted. ACDF of the included lower cervical spine. Surgical clip projects over the left humeral head with tunneled tendon fixation device projecting over the proximal right humeral diaphysis. Numerous surgical clips are seen in the upper abdomen. IMPRESSION: Emphysematous hyperinflation of the lungs, upper lobe predominant. No active pulmonary disease. Aortic atherosclerosis. Electronically Signed   By: Ashley Royalty M.D.   On: 07/19/2017 23:32   US Abdomen Limited Ruq  Result Date: 07/20/2017 CLINICAL DATA:  Abdominal pain EXAM: ULTRASOUND ABDOMEN LIMITED RIGHT UPPER QUADRANT COMPARISON:  None. FINDINGS: Gallbladder: A few small gallstones measuring up to 7 mm are identified near the neck of the gallbladder. There is physiologic distention of the gallbladder without mural thickening. Trace fluid in the gallbladder fossa abutting the gallbladder is  noted which can be a normal finding. Excluding the fluid, the gallbladder wall is not thickened. Where the gallbladder wall was measured includes this fluid and therefore is artificially thickened. No sonographic Murphy sign noted by sonographer. Common bile duct: Diameter: 7 mm, within normal limits for age. Liver: No focal lesion identified. Within normal limits in parenchymal echogenicity. Portal vein is patent on color Doppler imaging with normal direction of blood flow towards the liver. IMPRESSION: Physiologically distended gallbladder with a few gallstones noted near the neck. Trace fluid in the gallbladder fossa is seen which can be a normal finding. There is no sonographic Murphy's. Findings are equivocal for acute cholecystitis. HIDA scan if clinically necessary. Electronically Signed   By: Ashley Royalty M.D.   On: 07/20/2017 01:55    ____________________________________________   PROCEDURES  Procedure(s) performed: None  Procedures  Critical Care performed: No  ____________________________________________   INITIAL IMPRESSION / ASSESSMENT AND PLAN / ED COURSE  As part of my medical decision making, I reviewed the following data within the electronic MEDICAL RECORD NUMBER Notes from prior ED visits and [place] Controlled Substance Database   This is a 62 year old female who comes into the hospital today with some epigastric and upper abdominal pain as well as chest pain.  My differential diagnosis includes pancreatitis, cholecystitis, acute coronary syndrome, pneumonia   We did check some blood work on the patient.  The patient had a CBC, troponin and a BMP.  I added on a lipase and hepatic function panel.  The patient's lipase came back at 657.  Also the patient's AST was 138 and ALT was 77.  It appears that the patient has some gallstone pancreatitis.  We did give her 4 mg of morphine IV.  We will admit the patient to the hospitalist service for evaluation of her symptoms and treatment of  her pancreatitis.      ____________________________________________   FINAL CLINICAL IMPRESSION(S) / ED DIAGNOSES  Final diagnoses:  Abdominal pain  Gallstone pancreatitis     ED Discharge Orders    None       Note:  This document was prepared using Dragon voice recognition software and may include unintentional dictation errors.    Loney Hering, MD 07/20/17 [phone]

## 2017-07-21 ENCOUNTER — Encounter: Payer: Self-pay | Admitting: *Deleted

## 2017-07-21 ENCOUNTER — Inpatient Hospital Stay: Payer: Federal, State, Local not specified - PPO

## 2017-07-21 ENCOUNTER — Inpatient Hospital Stay: Payer: Federal, State, Local not specified - PPO | Admitting: Anesthesiology

## 2017-07-21 ENCOUNTER — Encounter
Admission: EM | Disposition: A | Discharge status: Home / Self Care | Payer: Self-pay | Source: Home / Self Care | Attending: Internal Medicine

## 2017-07-21 DIAGNOSIS — K851 Biliary acute pancreatitis without necrosis or infection: Principal | ICD-10-CM

## 2017-07-21 HISTORY — PX: CHOLECYSTECTOMY: SHX55

## 2017-07-21 LAB — CBC
HCT: 32.9 % — ABNORMAL LOW (ref 35.0–47.0)
Hemoglobin: 11.3 g/dL — ABNORMAL LOW (ref 12.0–16.0)
MCH: 30.2 pg (ref 26.0–34.0)
MCHC: 34.4 g/dL (ref 32.0–36.0)
MCV: 88 fL (ref 80.0–100.0)
PLATELETS: 133 10*3/uL — AB (ref 150–440)
RBC: 3.74 MIL/uL — ABNORMAL LOW (ref 3.80–5.20)
RDW: 13.5 % (ref 11.5–14.5)
WBC: 5.1 10*3/uL (ref 3.6–11.0)

## 2017-07-21 LAB — COMPREHENSIVE METABOLIC PANEL
ALBUMIN: 3 g/dL — AB (ref 3.5–5.0)
ALT: 37 U/L (ref 0–44)
AST: 33 U/L (ref 15–41)
Alkaline Phosphatase: 42 U/L (ref 38–126)
Anion gap: 3 — ABNORMAL LOW (ref 5–15)
BUN: 7 mg/dL (ref 6–20)
CO2: 27 mmol/L (ref 22–32)
CREATININE: 0.67 mg/dL (ref 0.44–1.00)
Calcium: 8.5 mg/dL — ABNORMAL LOW (ref 8.9–10.3)
Chloride: 113 mmol/L — ABNORMAL HIGH (ref 98–111)
GFR calc Af Amer: >60 mL/min (ref >60)
GFR calc non Af Amer: >60 mL/min (ref >60)
GLUCOSE: 87 mg/dL (ref 70–99)
POTASSIUM: 4.4 mmol/L (ref 3.5–5.1)
Sodium: 143 mmol/L (ref 135–145)
TOTAL PROTEIN: 5.3 g/dL — AB (ref 6.5–8.1)
Total Bilirubin: 0.6 mg/dL (ref 0.3–1.2)

## 2017-07-21 LAB — LIPASE, BLOOD: Lipase: 32 U/L (ref 11–51)

## 2017-07-21 SURGERY — LAPAROSCOPIC CHOLECYSTECTOMY WITH INTRAOPERATIVE CHOLANGIOGRAM
Anesthesia: General | Site: Abdomen | Wound class: Clean Contaminated

## 2017-07-21 MED ORDER — FENTANYL CITRATE (PF) 100 MCG/2ML IJ SOLN
INTRAMUSCULAR | Status: AC
  Filled 2017-07-21: qty 2

## 2017-07-21 MED ORDER — FENTANYL CITRATE (PF) 100 MCG/2ML IJ SOLN
INTRAMUSCULAR | Status: AC
  Administered 2017-07-21: 25 ug via INTRAVENOUS
  Filled 2017-07-21: qty 2

## 2017-07-21 MED ORDER — PROPOFOL 10 MG/ML IV BOLUS
INTRAVENOUS | Status: DC | PRN
  Administered 2017-07-21: 150 mg via INTRAVENOUS

## 2017-07-21 MED ORDER — ACETAMINOPHEN 10 MG/ML IV SOLN
INTRAVENOUS | Status: AC
  Filled 2017-07-21: qty 100

## 2017-07-21 MED ORDER — MIDAZOLAM HCL 2 MG/2ML IJ SOLN
INTRAMUSCULAR | Status: DC | PRN
  Administered 2017-07-21: 2 mg via INTRAVENOUS

## 2017-07-21 MED ORDER — FENTANYL CITRATE (PF) 100 MCG/2ML IJ SOLN
INTRAMUSCULAR | Status: AC
Start: 2017-07-21 — End: ?
  Filled 2017-07-21: qty 2

## 2017-07-21 MED ORDER — IOPAMIDOL (ISOVUE-300) INJECTION 61%
INTRAVENOUS | Status: DC | PRN
  Administered 2017-07-21: 10 mL via INTRAVENOUS

## 2017-07-21 MED ORDER — KETOROLAC TROMETHAMINE 30 MG/ML IJ SOLN
30.0000 mg | Freq: Four times a day (QID) | INTRAMUSCULAR | Status: DC
  Administered 2017-07-21 – 2017-07-22 (×4): 30 mg via INTRAVENOUS
  Filled 2017-07-21 (×4): qty 1

## 2017-07-21 MED ORDER — PROPOFOL 10 MG/ML IV BOLUS
INTRAVENOUS | Status: AC
  Filled 2017-07-21: qty 20

## 2017-07-21 MED ORDER — CEFAZOLIN (ANCEF) 1 G IV SOLR: 2.0000 g | INTRAVENOUS | Status: DC

## 2017-07-21 MED ORDER — GLYCOPYRROLATE 0.2 MG/ML IJ SOLN
INTRAMUSCULAR | Status: DC | PRN
  Administered 2017-07-21: 0.2 mg via INTRAVENOUS

## 2017-07-21 MED ORDER — DEXAMETHASONE SODIUM PHOSPHATE 10 MG/ML IJ SOLN
INTRAMUSCULAR | Status: DC | PRN
  Administered 2017-07-21: 10 mg via INTRAVENOUS

## 2017-07-21 MED ORDER — ACETAMINOPHEN 10 MG/ML IV SOLN
INTRAVENOUS | Status: DC | PRN
  Administered 2017-07-21: 1000 mg via INTRAVENOUS

## 2017-07-21 MED ORDER — KETOROLAC TROMETHAMINE 30 MG/ML IJ SOLN
INTRAMUSCULAR | Status: DC | PRN
  Administered 2017-07-21: 30 mg via INTRAVENOUS

## 2017-07-21 MED ORDER — HYDROMORPHONE HCL 1 MG/ML IJ SOLN
0.5000 mg | INTRAMUSCULAR | Status: DC | PRN
  Administered 2017-07-21 (×2): 0.5 mg via INTRAVENOUS

## 2017-07-21 MED ORDER — FENTANYL CITRATE (PF) 100 MCG/2ML IJ SOLN
INTRAMUSCULAR | Status: DC | PRN
  Administered 2017-07-21: 100 ug via INTRAVENOUS
  Administered 2017-07-21 (×3): 50 ug via INTRAVENOUS

## 2017-07-21 MED ORDER — FENTANYL CITRATE (PF) 100 MCG/2ML IJ SOLN
25.0000 ug | INTRAMUSCULAR | Status: AC | PRN
  Administered 2017-07-21 (×6): 25 ug via INTRAVENOUS

## 2017-07-21 MED ORDER — SUCCINYLCHOLINE CHLORIDE 20 MG/ML IJ SOLN
INTRAMUSCULAR | Status: DC | PRN
  Administered 2017-07-21: 100 mg via INTRAVENOUS

## 2017-07-21 MED ORDER — KETOROLAC TROMETHAMINE 30 MG/ML IJ SOLN
INTRAMUSCULAR | Status: AC
  Filled 2017-07-21: qty 1

## 2017-07-21 MED ORDER — ROCURONIUM BROMIDE 100 MG/10ML IV SOLN
INTRAVENOUS | Status: DC | PRN
  Administered 2017-07-21: 40 mg via INTRAVENOUS
  Administered 2017-07-21: 10 mg via INTRAVENOUS

## 2017-07-21 MED ORDER — SUGAMMADEX SODIUM 200 MG/2ML IV SOLN
INTRAVENOUS | Status: DC | PRN
  Administered 2017-07-21: 120.8 mg via INTRAVENOUS

## 2017-07-21 MED ORDER — HYDROMORPHONE HCL 1 MG/ML IJ SOLN
INTRAMUSCULAR | Status: AC
  Administered 2017-07-21: 0.5 mg via INTRAVENOUS
  Filled 2017-07-21: qty 1

## 2017-07-21 MED ORDER — MIDAZOLAM HCL 2 MG/2ML IJ SOLN
INTRAMUSCULAR | Status: AC
  Filled 2017-07-21: qty 2

## 2017-07-21 MED ORDER — CEFAZOLIN SODIUM-DEXTROSE 2-4 GM/100ML-% IV SOLN
2.0000 g | Freq: Once | INTRAVENOUS | Status: AC
  Administered 2017-07-21: 2 g via INTRAVENOUS
  Filled 2017-07-21: qty 100

## 2017-07-21 MED ORDER — LIDOCAINE HCL (CARDIAC) PF 100 MG/5ML IV SOSY
PREFILLED_SYRINGE | INTRAVENOUS | Status: DC | PRN
  Administered 2017-07-21: 100 mg via INTRAVENOUS

## 2017-07-21 MED ORDER — ONDANSETRON HCL 4 MG/2ML IJ SOLN: 4.0000 mg | Freq: Once | INTRAMUSCULAR | Status: DC | PRN

## 2017-07-21 SURGICAL SUPPLY — 46 items
APPLICATOR COTTON TIP 6 STRL (MISCELLANEOUS) ×1 IMPLANT
APPLICATOR COTTON TIP 6IN STRL (MISCELLANEOUS) ×2
APPLIER CLIP 5 13 M/L LIGAMAX5 (MISCELLANEOUS) ×2
BLADE SURG 15 STRL LF DISP TIS (BLADE) ×1 IMPLANT
BLADE SURG 15 STRL SS (BLADE) ×1
CANISTER SUCT 1200ML W/VALVE (MISCELLANEOUS) ×2 IMPLANT
CHLORAPREP W/TINT 26ML (MISCELLANEOUS) ×2 IMPLANT
CHOLANGIOGRAM CATH TAUT (CATHETERS) IMPLANT
CLEANER CAUTERY TIP 5X5 PAD (MISCELLANEOUS) ×1 IMPLANT
CLIP APPLIE 5 13 M/L LIGAMAX5 (MISCELLANEOUS) ×1 IMPLANT
DECANTER SPIKE VIAL GLASS SM (MISCELLANEOUS) ×4 IMPLANT
DERMABOND ADVANCED (GAUZE/BANDAGES/DRESSINGS) ×1
DERMABOND ADVANCED .7 DNX12 (GAUZE/BANDAGES/DRESSINGS) ×1 IMPLANT
DRAPE C-ARM XRAY 36X54 (DRAPES) ×2 IMPLANT
ELECT CAUTERY BLADE 6.4 (BLADE) ×2 IMPLANT
ELECT REM PT RETURN 9FT ADLT (ELECTROSURGICAL) ×2
ELECTRODE REM PT RTRN 9FT ADLT (ELECTROSURGICAL) ×1 IMPLANT
GLOVE BIO SURGEON STRL SZ7 (GLOVE) ×2 IMPLANT
GOWN STRL REUS W/ TWL LRG LVL3 (GOWN DISPOSABLE) ×3 IMPLANT
GOWN STRL REUS W/TWL LRG LVL3 (GOWN DISPOSABLE) ×3
IRRIGATION STRYKERFLOW (MISCELLANEOUS) ×1 IMPLANT
IRRIGATOR STRYKERFLOW (MISCELLANEOUS) ×2
IV CATH ANGIO 12GX3 LT BLUE (NEEDLE) ×2 IMPLANT
IV NS 1000ML (IV SOLUTION) ×1
IV NS 1000ML BAXH (IV SOLUTION) ×1 IMPLANT
L-HOOK LAP DISP 36CM (ELECTROSURGICAL) ×2
LHOOK LAP DISP 36CM (ELECTROSURGICAL) ×1 IMPLANT
NEEDLE HYPO 22GX1.5 SAFETY (NEEDLE) ×2 IMPLANT
PACK LAP CHOLECYSTECTOMY (MISCELLANEOUS) ×2 IMPLANT
PAD CLEANER CAUTERY TIP 5X5 (MISCELLANEOUS) ×1
PENCIL ELECTRO HAND CTR (MISCELLANEOUS) ×2 IMPLANT
POUCH SPECIMEN RETRIEVAL 10MM (ENDOMECHANICALS) ×2 IMPLANT
SCISSORS METZENBAUM CVD 33 (INSTRUMENTS) ×2 IMPLANT
SLEEVE ENDOPATH XCEL 5M (ENDOMECHANICALS) ×4 IMPLANT
SOL ANTI-FOG 6CC FOG-OUT (MISCELLANEOUS) ×1 IMPLANT
SOL FOG-OUT ANTI-FOG 6CC (MISCELLANEOUS) ×1
SPONGE LAP 18X18 RF (DISPOSABLE) ×2 IMPLANT
STOPCOCK 4 WAY LG BORE MALE ST (IV SETS) IMPLANT
SUT ETHIBOND 0 MO6 C/R (SUTURE) IMPLANT
SUT MNCRL AB 4-0 PS2 18 (SUTURE) ×2 IMPLANT
SUT VICRYL 0 AB UR-6 (SUTURE) ×4 IMPLANT
SYR 20CC LL (SYRINGE) ×2 IMPLANT
TROCAR XCEL BLUNT TIP 100MML (ENDOMECHANICALS) ×2 IMPLANT
TROCAR XCEL NON-BLD 5MMX100MML (ENDOMECHANICALS) ×2 IMPLANT
TUBING INSUFFLATION (TUBING) ×2 IMPLANT
WATER STERILE IRR 1000ML POUR (IV SOLUTION) ×2 IMPLANT

## 2017-07-21 NOTE — Anesthesia Procedure Notes (Signed)
Procedure Name: Intubation Date/Time: 07/21/2017 10:50 AM Performed by: Nelda Marseille, CRNA Pre-anesthesia Checklist: Patient identified, Patient being monitored, Timeout performed, Emergency Drugs available and Suction available Patient Re-evaluated:Patient Re-evaluated prior to induction Oxygen Delivery Method: Circle system utilized Preoxygenation: Pre-oxygenation with 100% oxygen Induction Type: IV induction Ventilation: Mask ventilation without difficulty Laryngoscope Size: Mac and 3 Grade View: Grade II Tube type: Oral Tube size: 7.0 mm Number of attempts: 1 Airway Equipment and Method: Stylet Placement Confirmation: ETT inserted through vocal cords under direct vision,  positive ETCO2 and breath sounds checked- equal and bilateral Secured at: 21 cm Tube secured with: Tape Dental Injury: Teeth and Oropharynx as per pre-operative assessment

## 2017-07-21 NOTE — Progress Notes (Signed)
Noted patient is for cholecystectomy for gall stone pancreatitis.   GI will sign out please call with questions  Dr Jonathon Bellows MD,MRCP [facility]) Gastroenterology/Hepatology Pager: [phone]

## 2017-07-21 NOTE — Transfer of Care (Signed)
Immediate Anesthesia Transfer of Care Note  Patient: Shelly Dunn  Procedure(s) Performed: LAPAROSCOPIC CHOLECYSTECTOMY WITH INTRAOPERATIVE CHOLANGIOGRAM (N/A Abdomen)  Patient Location: PACU  Anesthesia Type:General  Level of Consciousness: awake, alert  and sedated  Airway & Oxygen Therapy: Patient Spontanous Breathing and Patient connected to face mask oxygen  Post-op Assessment: Report given to RN and Post -op Vital signs reviewed and stable  Post vital signs: Reviewed and stable  Last Vitals:  Vitals Value Taken Time  BP 148/77 07/21/2017 12:02 PM  Temp 36.4 C 07/21/2017 12:02 PM  Pulse 97 07/21/2017 12:06 PM  Resp 23 07/21/2017 12:06 PM  SpO2 100 % 07/21/2017 12:06 PM  Vitals shown include unvalidated device data.  Last Pain:  Vitals:   07/21/17 1202  TempSrc:   PainSc: 10-Worst pain ever      Patients Stated Pain Goal: 0 (08/88/35 8446)  Complications: No apparent anesthesia complications

## 2017-07-21 NOTE — Anesthesia Preprocedure Evaluation (Addendum)
Anesthesia Evaluation  Patient identified by MRN, date of birth, ID band Patient awake    Reviewed: Allergy & Precautions, H&P , NPO status , Patient's Chart, lab work & pertinent test results, reviewed documented beta blocker date and time   Airway Mallampati: II  TM Distance: >3 FB Neck ROM: full    Dental  (+) Teeth Intact, Partial Upper, Dental Advisory Given,    Pulmonary neg pulmonary ROS, former smoker,    Pulmonary exam normal        Cardiovascular Exercise Tolerance: Good negative cardio ROS Normal cardiovascular exam Rhythm:regular Rate:Normal     Neuro/Psych negative neurological ROS  negative psych ROS   GI/Hepatic negative GI ROS, Neg liver ROS,   Endo/Other  negative endocrine ROS  Renal/GU negative Renal ROS  negative genitourinary   Musculoskeletal   Abdominal   Peds  Hematology negative hematology ROS (+)   Anesthesia Other Findings Past Medical History: No date: CML (chronic myelocytic leukemia) (HCC) No date: Depression Past Surgical History: No date: APPENDECTOMY No date: BREAST ENHANCEMENT SURGERY No date: CARPAL TUNNEL RELEASE No date: CERVICAL SPINE SURGERY No date: CESAREAN SECTION No date: ROTATOR CUFF REPAIR No date: TOTAL SHOULDER REPLACEMENT BMI    Body Mass Index:  19.66 kg/m     Reproductive/Obstetrics negative OB ROS                            Anesthesia Physical Anesthesia Plan  ASA: II and emergent  Anesthesia Plan: General ETT   Post-op Pain Management:    Induction:   PONV Risk Score and Plan:   Airway Management Planned:   Additional Equipment:   Intra-op Plan:   Post-operative Plan:   Informed Consent: I have reviewed the patients History and Physical, chart, labs and discussed the procedure including the risks, benefits and alternatives for the proposed anesthesia with the patient or authorized representative who has indicated  his/her understanding and acceptance.   Dental Advisory Given  Plan Discussed with: CRNA  Anesthesia Plan Comments:         Anesthesia Quick Evaluation

## 2017-07-21 NOTE — Progress Notes (Signed)
Pt stated Front top bridge loose and fell out.  Dr. Andree Elk notified and at bedside.  Bridge put in specimen cup wrapped in paper towel for safe keeping.

## 2017-07-21 NOTE — Progress Notes (Addendum)
Initial Nutrition Assessment  DOCUMENTATION CODES:   Not applicable  INTERVENTION:   Recommend Boost Breeze po TID, each supplement provides 250 kcal and 9 grams of protein  Recommend MVI daily  Pt likely at refeed risk; recommend monitor K, Mg, and P labs when oral intake improves.   Pt at high risk for nutrient deficiencies r/t her gastric bypass surgery. Recommend check thiamine, B12, folate, iron, calcium, zinc, copper and vitamins D, A, E, & K labs yearly.   NUTRITION DIAGNOSIS:   Inadequate oral intake related to acute illness as evidenced by other (comment)(pt on clear liquid diet ).  GOAL:   Patient will meet greater than or equal to 90% of their needs  MONITOR:   PO intake, Supplement acceptance, Diet advancement, Labs, Weight trends, Skin, I & O's  REASON FOR ASSESSMENT:   Malnutrition Screening Tool    ASSESSMENT:   62 y/o female admitted with gallstone pancreatitis now s/p cholecystectomy 7/1   Met with pt in room today. Pt reports poor appetite and oral intake for the past 6 months r/t stress, nausea, vomting and abdominal pain. Pt reports that she hasn't been able to keep most food down as she vomits after eating. Pt with h/o Roux-en-Y gastric bypass surgery in 2013. Pt reports at her heaviest that she weighed 267lbs. Pt was 243lbs at the time of her surgery. Pt lost 95lbs and maintained around 148lbs up until March 2019 when she reports increased stress from her job made her appetite decrease even more. Pt reports an unintentional 18lb wt loss since March; this would be a 10% loss in 4 months which would be significant. There is no recent wt history in chart to confirm. Pt reports that she takes her bariatric vitamins every day. Pt reports she has not had any vitamin labs checked. Pt stopped drinking her protein supplements in March because she kept vomiting them back up. Pt was drinking home made protein shakes. Pt s/p cholecystectomy today. Pt eating beef broth  at time of RD visit today. Pt is willing to drink Premier Protein when diet advanced. Pt at high risk for nutrient deficiencies r/t her gastric bypass surgery. Recommend check thiamine, B12, folate, iron, calcium, zinc, copper and vitamins D, A, E, & K labs yearly.   Medications reviewed and include: bosulif, tums, vitamin D, B12, colace, pepcid, folic acid, heparin, NaCl @125ml /hr, zofran, oxycodone    Labs reviewed: K 4.4 wnl, Ca 8.5(L) adj. 9.3 wnl, alb 3.0(L)  NUTRITION - FOCUSED PHYSICAL EXAM:    Most Recent Value  Orbital Region  No depletion  Upper Arm Region  No depletion  Thoracic and Lumbar Region  No depletion  Buccal Region  No depletion  Temple Region  No depletion  Clavicle Bone Region  No depletion  Clavicle and Acromion Bone Region  No depletion  Scapular Bone Region  No depletion  Dorsal Hand  No depletion  Patellar Region  No depletion  Anterior Thigh Region  No depletion  Posterior Calf Region  No depletion  Edema (RD Assessment)  None  Hair  Reviewed  Eyes  Reviewed  Mouth  Reviewed  Skin  Reviewed  Nails  Reviewed     Diet Order:   Diet Order           Diet clear liquid Room service appropriate? Yes; Fluid consistency: Thin  Diet effective now         EDUCATION NEEDS:   Education needs have been addressed  Skin:  Skin Assessment:  Reviewed RN Assessment(incision abdomen )  Last BM:  6/29  Height:   Ht Readings from Last 1 Encounters:  07/21/17 5' 4"  (1.626 m)    Weight:   Wt Readings from Last 1 Encounters:  07/20/17 133 lb 2.5 oz (60.4 kg)    Ideal Body Weight:  54.5 kg  BMI:  Body mass index is 22.86 kg/m.  Estimated Nutritional Needs:   Kcal:  1500-1700kcal/day   Protein:  72-85g/day   Fluid:  >1.5L/day   Koleen Distance MS, RD, LDN Pager #- [phone] Office#- [phone] After Hours Pager: [phone]

## 2017-07-21 NOTE — Anesthesia Post-op Follow-up Note (Signed)
Anesthesia QCDR form completed.        

## 2017-07-21 NOTE — Progress Notes (Signed)
Resolving GS pancreatitis.   The risks, benefits, complications, treatment options, and expected outcomes were discussed with the patient. The possibilities of bleeding, recurrent infection, finding a normal gallbladder, perforation of viscus organs, damage to surrounding structures, bile leak, abscess formation, needing a drain placed, the need for additional procedures, reaction to medication, pulmonary aspiration,  failure to diagnose a condition, the possible need to convert to an open procedure, and creating a complication requiring transfusion or operation were discussed with the patient. The patient and/or family concurred with the proposed plan, giving informed consent.

## 2017-07-21 NOTE — Op Note (Signed)
Laparoscopic Cholecystectomy  Pre-operative Diagnosis: Cholecystitis and Gallstone pancreatitis  Post-operative Diagnosis:  Same  Procedure: Lap cholecystectomy w IOC  Surgeon: Caroleen Hamman, MD FACS  Anesthesia: Gen. with endotracheal tube   Findings: Cholecystitis  No evidence of filling defects on IOC, no evidence of Biliary injuries.  Estimated Blood Loss: 50cc         Drains: none         Specimens: Gallbladder           Complications: none   Procedure Details  The patient was seen again in the Holding Room. The benefits, complications, treatment options, and expected outcomes were discussed with the patient. The risks of bleeding, infection, recurrence of symptoms, failure to resolve symptoms, bile duct damage, bile duct leak, retained common bile duct stone, bowel injury, any of which could require further surgery and/or ERCP, stent, or papillotomy were reviewed with the patient. The likelihood of improving the patient's symptoms with return to their baseline status is good.  The patient and/or family concurred with the proposed plan, giving informed consent.  The patient was taken to Operating Room, identified as Shelly Dunn and the procedure verified as Laparoscopic Cholecystectomy.  A Time Out was held and the above information confirmed.  Prior to the induction of general anesthesia, antibiotic prophylaxis was administered. VTE prophylaxis was in place. General endotracheal anesthesia was then administered and tolerated well. After the induction, the abdomen was prepped with Chloraprep and draped in the sterile fashion. The patient was positioned in the supine position.  Cut down technique was used to enter the abdominal cavity and a Hasson trochar was placed after two vicryl stitches were anchored to the fascia. Pneumoperitoneum was then created with CO2 and tolerated well without any adverse changes in the patient's vital signs.  Three 5-mm ports were placed in the  right upper quadrant all under direct vision. All skin incisions  were infiltrated with a local anesthetic agent before making the incision and placing the trocars.   The patient was positioned  in reverse Trendelenburg, tilted slightly to the patient's left.  The gallbladder was identified, the fundus grasped and retracted cephalad. Adhesions were lysed bluntly. The infundibulum was grasped and retracted laterally, exposing the peritoneum overlying the triangle of Calot. This was then divided and exposed in a blunt fashion. An extended critical view of the cystic duct and cystic artery was obtained.  The cystic duct was clearly identified and bluntly dissected.   Clip was placed an ductotomy performed, a Catheter was inserted and clip, Contrast was injected to obtain a cholangiogram. Multiple views revealed no filling contrast, dye reaches the duodenum and hepatic radicals. No injuries were observed. Catheter removed.  Artery and duct were double clipped and divided.  The gallbladder was taken from the gallbladder fossa in a retrograde fashion with the electrocautery. The gallbladder was removed and placed in an Endocatch bag. The liver bed was irrigated and inspected. Hemostasis was achieved with the electrocautery. Copious irrigation was utilized and was repeatedly aspirated until clear.  The gallbladder and Endocatch sac were then removed through a port site.     Inspection of the right upper quadrant was performed. No bleeding, bile duct injury or leak, or bowel injury was noted. Pneumoperitoneum was released.  The periumbilical port site was closed with interrumpted 0 Vicryl sutures. 4-0 subcuticular Monocryl was used to close the skin. Dermabond was  applied.  The patient was then extubated and brought to the recovery room in stable condition. Sponge, lap,  and needle counts were correct at closure and at the conclusion of the case.               Caroleen Hamman, MD, FACS

## 2017-07-22 ENCOUNTER — Encounter: Payer: Self-pay | Admitting: Surgery

## 2017-07-22 LAB — SURGICAL PATHOLOGY

## 2017-07-22 MED ORDER — OXYCODONE HCL 10 MG PO TABS: 15.0000 mg | ORAL_TABLET | ORAL | 0 refills | Status: AC | PRN

## 2017-07-22 NOTE — Progress Notes (Addendum)
POD # 1  Doing well Taking PO  PE NAD Abd: soft, minimal appropriate incisional tenderness  A/P Doing well May shower tomorrow Lifting restriction < 20 lbs x 6 wks D/W pt oin detail May DC from surgical perspective Pt apparently had issues with teeth bridges,Anesthesia informed

## 2017-07-22 NOTE — Anesthesia Postprocedure Evaluation (Signed)
Anesthesia Post Note  Patient: Shelly Dunn  Procedure(s) Performed: LAPAROSCOPIC CHOLECYSTECTOMY WITH INTRAOPERATIVE CHOLANGIOGRAM (N/A Abdomen)  Patient location during evaluation: PACU Anesthesia Type: General Level of consciousness: awake and alert Pain management: pain level controlled Vital Signs Assessment: post-procedure vital signs reviewed and stable Respiratory status: spontaneous breathing, nonlabored ventilation, respiratory function stable and patient connected to nasal cannula oxygen Cardiovascular status: blood pressure returned to baseline and stable Postop Assessment: no apparent nausea or vomiting Anesthetic complications: no     Last Vitals:  Vitals:   07/21/17 1956 07/22/17 0408  BP: 101/70 (!) 120/50  Pulse: (!) 57 (!) 54  Resp: 20 20  Temp: 37.1 C 36.8 C  SpO2: 98% 95%    Last Pain:  Vitals:   07/22/17 0601  TempSrc:   [name]

## 2017-07-22 NOTE — Progress Notes (Signed)
Shelly Dunn  A and O x 4. VSS. Pt tolerating diet well. No complaints of pain or nausea. IV removed intact, prescriptions given. Pt voiced understanding of discharge instructions with no further questions. Pt discharged via wheelchair with axillary.     Allergies as of 07/22/2017   No Known Allergies     Medication List    TAKE these medications   Biotin 1000 MCG tablet Take 1,000 mcg by mouth 3 (three) times daily.   BOSULIF 100 MG tablet Generic drug:  bosutinib Take 300 mg by mouth daily.   calcium carbonate 1250 (500 Ca) MG tablet Commonly known as:  OS-CAL - dosed in mg of elemental calcium Take 1 tablet by mouth.   CHANTIX 1 MG tablet Generic drug:  varenicline Take 1 mg by mouth 2 (two) times daily.   cholecalciferol 1000 units tablet Commonly known as:  VITAMIN D Take 1,000 Units by mouth daily.   cyanocobalamin 1000 MCG/ML injection Commonly known as:  (VITAMIN B-12) Inject 1 mL into the muscle every 30 (thirty) days.   escitalopram 20 MG tablet Commonly known as:  LEXAPRO Take 20 mg by mouth daily.   famotidine 40 MG tablet Commonly known as:  PEPCID Take 40 mg by mouth daily.   FLAX SEEDS PO Take 1 capsule by mouth daily.   folic acid 1 MG tablet Commonly known as:  FOLVITE Take 1 mg by mouth daily.   Oxycodone HCl 10 MG Tabs Take 1.5 tablets (15 mg total) by mouth every 4 (four) hours as needed for up to 3 days. What changed:    how much to take  reasons to take this   TURMERIC PO Take 1 capsule by mouth daily.       Vitals:   07/21/17 1956 07/22/17 0408  BP: 101/70 (!) 120/50  Pulse: (!) 57 (!) 54  Resp: 20 20  Temp: 98.7 F (37.1 C) 98.2 F (36.8 C)  SpO2: 98% 95%    Francesco Sor

## 2017-07-22 NOTE — Progress Notes (Signed)
Per MD okay for RN to change diet order to regular diet.

## 2017-07-30 ENCOUNTER — Encounter: Payer: Self-pay | Admitting: Surgery

## 2017-07-31 NOTE — Progress Notes (Signed)
[facility] [place] NAME: Shelly Dunn    MR#:  [id number]  DATE OF BIRTH:  [birth year]  SUBJECTIVE:  CHIEF COMPLAINT:   Chief Complaint  Patient presents with  . Chest Pain   Pain improved S/p cholecystectomy  REVIEW OF SYSTEMS:    Review of Systems  Constitutional: Positive for malaise/fatigue. Negative for chills and fever.  HENT: Negative for sore throat.   Eyes: Negative for blurred vision, double vision and pain.  Respiratory: Negative for cough, hemoptysis, shortness of breath and wheezing.   Cardiovascular: Negative for chest pain, palpitations, orthopnea and leg swelling.  Gastrointestinal: Positive for abdominal pain. Negative for constipation, diarrhea, heartburn, nausea and vomiting.  Genitourinary: Negative for dysuria and hematuria.  Musculoskeletal: Negative for back pain and joint pain.  Skin: Negative for rash.  Neurological: Negative for sensory change, speech change, focal weakness and headaches.  Endo/Heme/Allergies: Does not bruise/bleed easily.  Psychiatric/Behavioral: Negative for depression. The patient is not nervous/anxious.     DRUG ALLERGIES:  No Known Allergies  VITALS:  Blood pressure (!) 120/50, pulse (!) 54, temperature 98.2 F (36.8 C), temperature source Oral, resp. rate 20, height 5\' 4"  (1.626 m), weight 69.1 kg (152 lb 5.4 oz), SpO2 95 %.  PHYSICAL EXAMINATION:   Physical Exam  GENERAL:  62 y.o.-year-old patient lying in the bed with no acute distress.  EYES: Pupils equal, round, reactive to light and accommodation. No scleral icterus. Extraocular muscles intact.  HEENT: Head atraumatic, normocephalic. Oropharynx and nasopharynx clear.  NECK:  Supple, no jugular venous distention. No thyroid enlargement, no tenderness.  LUNGS: Normal breath sounds bilaterally, no wheezing, rales, rhonchi. No use of accessory muscles of respiration.  CARDIOVASCULAR: S1, S2 normal. No murmurs, rubs, or  gallops.  ABDOMEN: Soft, surgical wounds EXTREMITIES: No cyanosis, clubbing or edema b/l.    NEUROLOGIC: Cranial nerves II through XII are intact. No focal Motor or sensory deficits b/l. PSYCHIATRIC: The patient is alert and oriented x 3.  SKIN: No obvious rash, lesion, or ulcer.   LABORATORY PANEL:   CBC No results for input(s): WBC, HGB, HCT, PLT in the last 168 hours. ------------------------------------------------------------------------------------------------------------------ Chemistries  No results for input(s): NA, K, CL, CO2, GLUCOSE, BUN, CREATININE, CALCIUM, MG, AST, ALT, ALKPHOS, BILITOT in the last 168 hours.  Invalid input(s): GFRCGP ------------------------------------------------------------------------------------------------------------------  Cardiac Enzymes No results for input(s): TROPONINI in the last 168 hours. ------------------------------------------------------------------------------------------------------------------  RADIOLOGY:  No results found.   ASSESSMENT AND PLAN:   This is a 62 year old female admitted for pancreatitis. 1.  Gallstone pancreatitis.  Improved.  Status post cholecystectomy today.  Likely discharge tomorrow.  Clear liquid diet. 2.  Transaminitis: Due to gallstone pancreatitis.  Follow.   3.  CML: Continue home medications 5.  Hypothyroidism: On Synthroid 6.  Tobacco abuse: Continue Chantix    All the records are reviewed and case discussed with Care Management/Social Workerr. Management plans discussed with the patient, family and they are in agreement.  CODE STATUS: FULL CODE  DVT Prophylaxis: SCDs  TOTAL TIME TAKING CARE OF THIS PATIENT: 35 minutes.   POSSIBLE D/C IN 1-2 DAYS, DEPENDING ON CLINICAL CONDITION.  Neita Carp M.D on 07/31/2017 at 6:01 PM  Between 7am to 6pm - Pager - [phone]  After 6pm go to [email or web address] - password [email or web address] [facility]  Office   [phone]  CC: Primary care physician; System, Pcp Not In  Note: This dictation was prepared with Dragon dictation along with smaller  Company secretary. Any transcriptional errors that result from this process are unintentional.

## 2017-07-31 NOTE — Discharge Summary (Signed)
[facility] [place] NAME: Shelly Dunn    MR#:  [id number]  DATE OF BIRTH:  [birth year]  DATE OF ADMISSION:  07/20/2017 ADMITTING PHYSICIAN: Harrie Foreman, MD  DATE OF DISCHARGE: 07/22/2017  3:04 PM  PRIMARY CARE PHYSICIAN: System, Pcp Not In   ADMISSION DIAGNOSIS:  Gallstone pancreatitis [K85.10] Abdominal pain [R10.9]  DISCHARGE DIAGNOSIS:  Active Problems:   Gallstone pancreatitis   SECONDARY DIAGNOSIS:   Past Medical History:  Diagnosis Date  . CML (chronic myelocytic leukemia) ([facility])   . Depression    ADMITTING HISTORY  Chief Complaint: Chest pain HPI: The patient with past medical history of CML presents to the emergency department complaining of chest pain.  The patient locates the pain mid epigastric area and reports that it radiates into her chest.  She admits to mild nausea but no vomiting.  [facility] evaluation revealed elevated lipase.  Ultrasound of the patient's right upper quadrant did not confirm a ductal stone but due to the patient's ongoing pain as well as transaminitis the emergency department staff called the hospitalist service for admission.  HOSPITAL COURSE:   *Gallstone pancreatitis.  Patient was admitted to the hospital.  Pain slowly improved.  Had cholecystectomy.  Patient was started on clear liquid diet after surgery.  By the day of discharge she is tolerating soft food.  Pain is well controlled.  Prescription for pain medications given for 3 days.  Follow-up with surgery as outpatient.  Other comorbidities remained stable.  Patient discharged home in stable condition.  CONSULTS OBTAINED:  Treatment Team:  Jules Husbands, MD  DRUG ALLERGIES:  No Known Allergies  DISCHARGE MEDICATIONS:   Allergies as of 07/22/2017   No Known Allergies     Medication List    STOP taking these medications   Oxycodone HCl 10 MG Tabs     TAKE these medications   Biotin 1000 MCG tablet Take 1,000 mcg by mouth 3  (three) times daily.   BOSULIF 100 MG tablet Generic drug:  bosutinib Take 300 mg by mouth daily.   calcium carbonate 1250 (500 Ca) MG tablet Commonly known as:  OS-CAL - dosed in mg of elemental calcium Take 1 tablet by mouth.   CHANTIX 1 MG tablet Generic drug:  varenicline Take 1 mg by mouth 2 (two) times daily.   cholecalciferol 1000 units tablet Commonly known as:  VITAMIN D Take 1,000 Units by mouth daily.   cyanocobalamin 1000 MCG/ML injection Commonly known as:  (VITAMIN B-12) Inject 1 mL into the muscle every 30 (thirty) days.   escitalopram 20 MG tablet Commonly known as:  LEXAPRO Take 20 mg by mouth daily.   famotidine 40 MG tablet Commonly known as:  PEPCID Take 40 mg by mouth daily.   FLAX SEEDS PO Take 1 capsule by mouth daily.   folic acid 1 MG tablet Commonly known as:  FOLVITE Take 1 mg by mouth daily.   TURMERIC PO Take 1 capsule by mouth daily.     ASK your doctor about these medications   Oxycodone HCl 10 MG Tabs Take 1.5 tablets (15 mg total) by mouth every 4 (four) hours as needed for up to 3 days. Ask about: Should I take this medication?       Today   VITAL SIGNS:  Blood pressure (!) 120/50, pulse (!) 54, temperature 98.2 F (36.8 C), temperature source Oral, resp. rate 20, height 5\' 4"  (1.626 m), weight 69.1 kg (152 lb 5.4  oz), SpO2 95 %.  I/O:  No intake or output data in the 24 hours ending 07/31/17 1759  PHYSICAL EXAMINATION:  Physical Exam  GENERAL:  62 y.o.-year-old patient lying in the bed with no acute distress.  LUNGS: Normal breath sounds bilaterally, no wheezing, rales,rhonchi or crepitation. No use of accessory muscles of respiration.  CARDIOVASCULAR: S1, S2 normal. No murmurs, rubs, or gallops.  ABDOMEN: Soft, non-tender, non-distended. Bowel sounds present. No organomegaly or mass.  NEUROLOGIC: Moves all 4 extremities. PSYCHIATRIC: The patient is alert and oriented x 3.  SKIN: Abdominal surgical wounds without  any bleeding or discharge.  DATA REVIEW:   CBC No results for input(s): WBC, HGB, HCT, PLT in the last 168 hours.  Chemistries  No results for input(s): NA, K, CL, CO2, GLUCOSE, BUN, CREATININE, CALCIUM, MG, AST, ALT, ALKPHOS, BILITOT in the last 168 hours.  Invalid input(s): GFRCGP  Cardiac Enzymes No results for input(s): TROPONINI in the last 168 hours.  Microbiology Results  No results found for this or any previous visit.  RADIOLOGY:  No results found.  Follow up with PCP in 1 week.  Management plans discussed with the patient, family and they are in agreement.  CODE STATUS:  Code Status History    Date Active Date Inactive Code Status Order ID Comments User Context   07/20/2017 0517 07/22/2017 1810 Full Code [id number]  Harrie Foreman, MD Inpatient      TOTAL TIME TAKING CARE OF THIS PATIENT ON DAY OF DISCHARGE: more than 30 minutes.   Neita Carp M.D on 07/31/2017 at 5:59 PM  Between 7am to 6pm - Pager - [phone]  After 6pm go to [email or web address] - password [email or web address] [facility]  Office  [phone]  CC: Primary care physician; System, Pcp Not In  Note: This dictation was prepared with Dragon dictation along with smaller phrase technology. Any transcriptional errors that result from this process are unintentional.

## 2019-09-27 IMAGING — US US ABDOMEN LIMITED
1 series · 14 of 25 positions shown · non-contrast
Comparison: None.

CLINICAL DATA: Abdominal pain

EXAM:
ULTRASOUND ABDOMEN LIMITED RIGHT UPPER QUADRANT

[Series 1: us abdomen limited · 0.14mm/px · 14 of 73 slices shown]
[im 1/73]
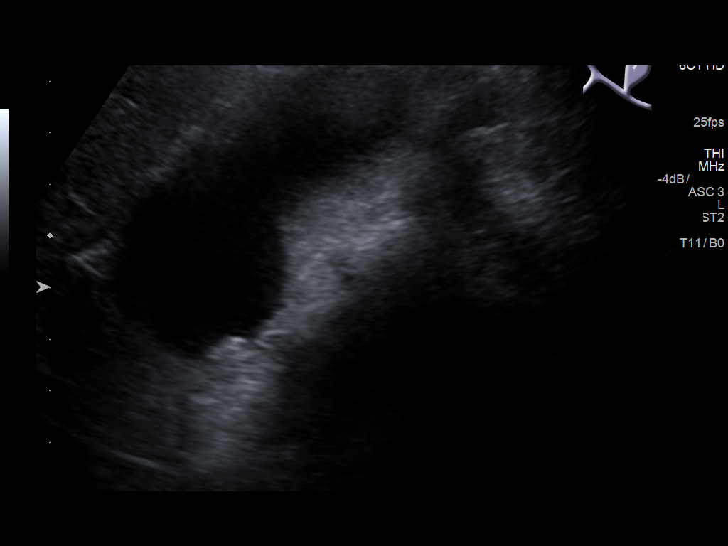
[im 7/73]
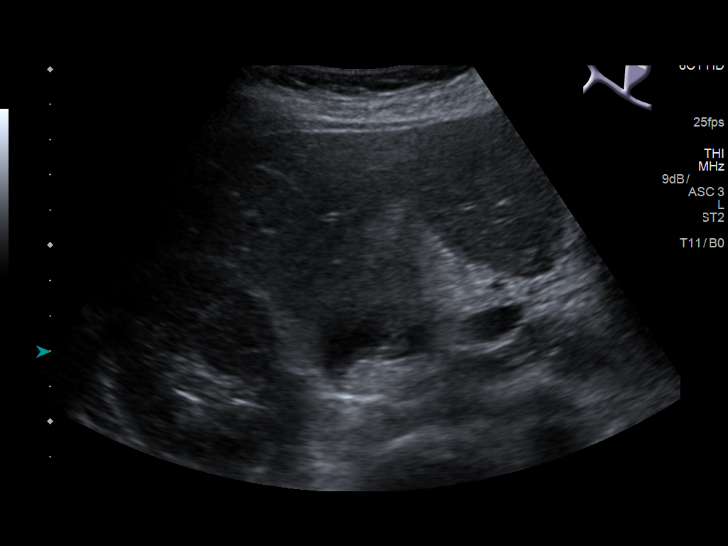
[im 13/73]
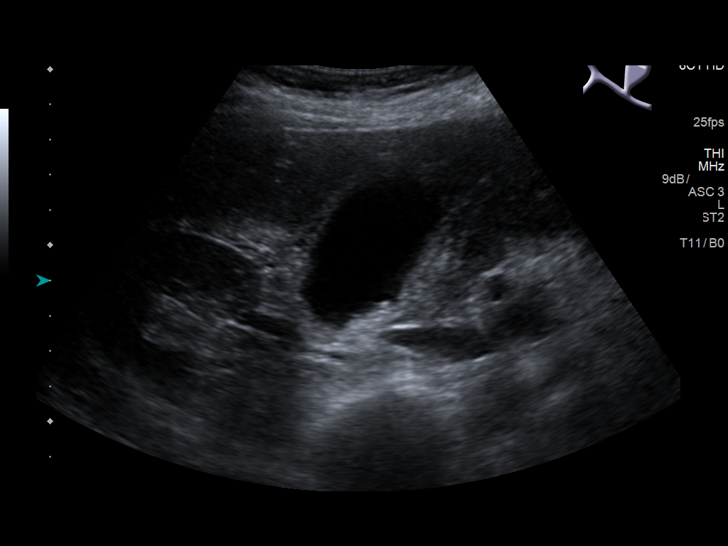
[im 19/73]
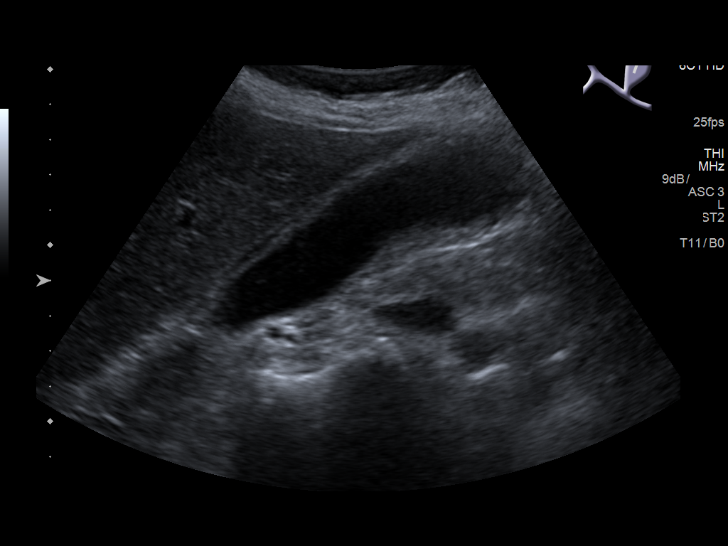
[im 25/73]
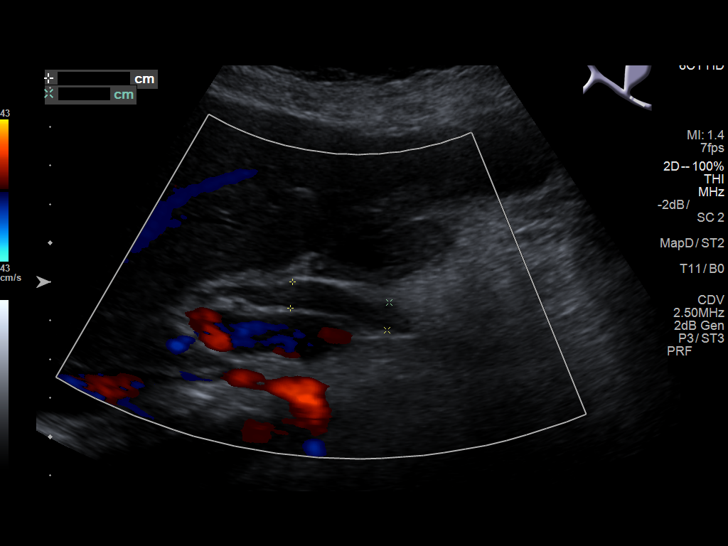
[im 28/73]
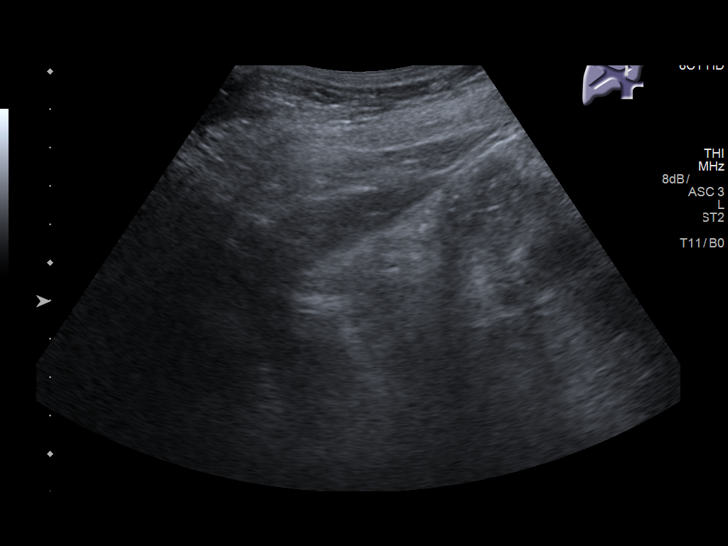
[im 34/73]
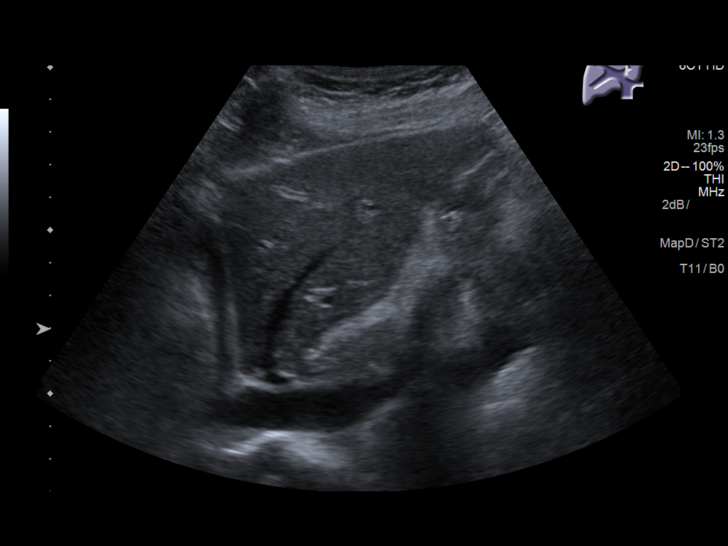
[im 40/73]
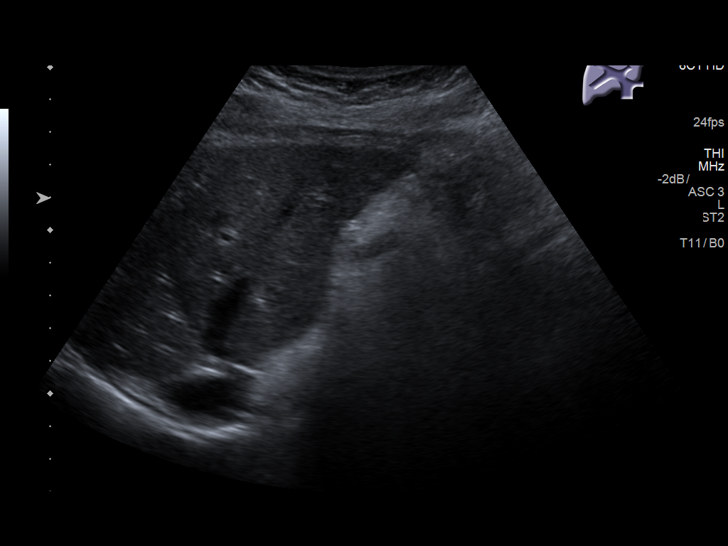
[im 46/73]
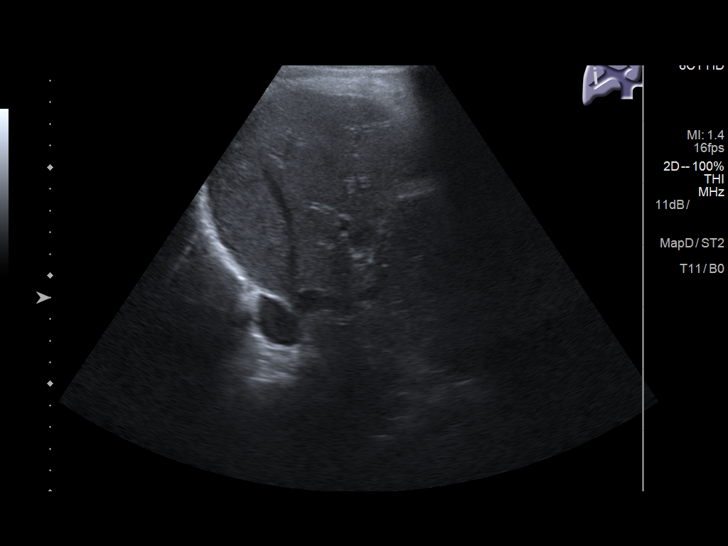
[im 49/73]
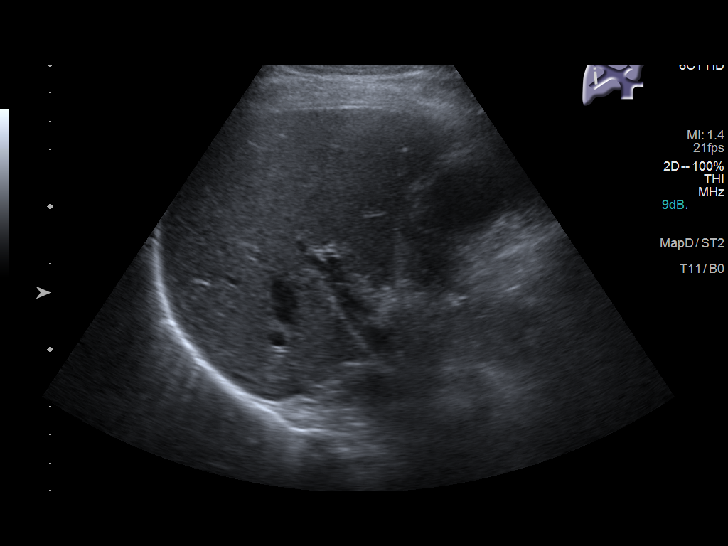
[im 55/73]
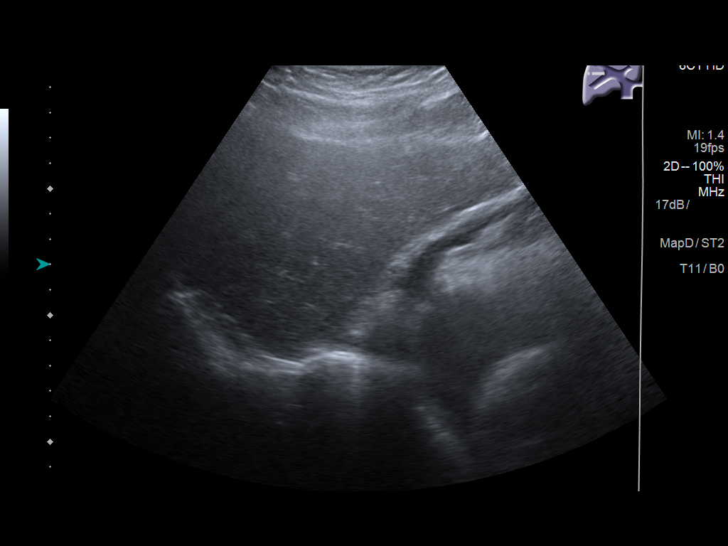
[im 61/73]
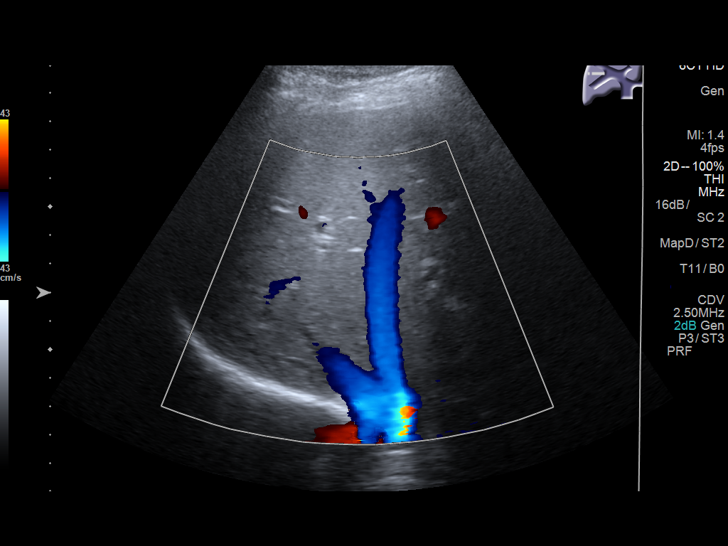
[im 67/73]
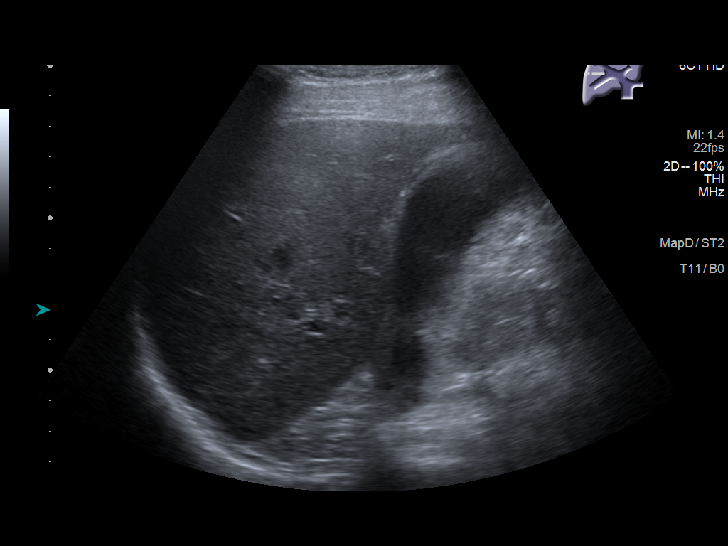
[im 73/73]
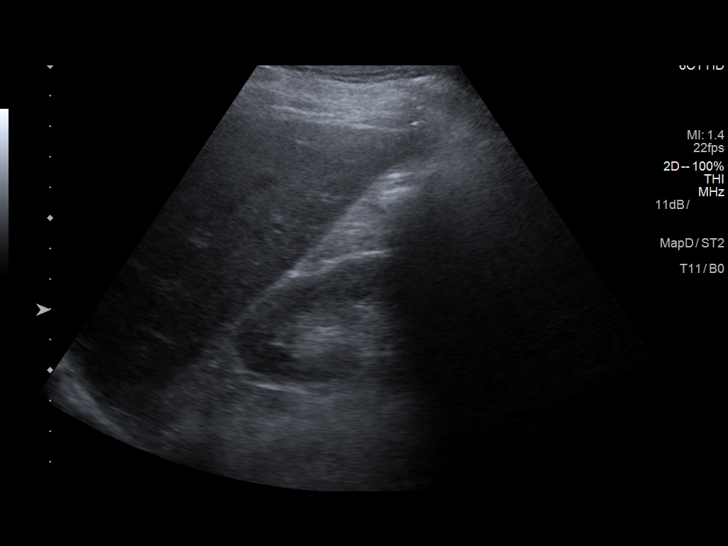

[14 of 25 positions shown; findings below may reference images not displayed]

FINDINGS: Gallbladder:

A few small gallstones measuring up to 7 mm are identified near the
neck of the gallbladder. There is physiologic distention of the
gallbladder without mural thickening. Trace fluid in the gallbladder
fossa abutting the gallbladder is noted which can be a normal
finding. Excluding the fluid, the gallbladder wall is not thickened.
Where the gallbladder wall was measured includes this fluid and
therefore is artificially thickened. No sonographic Murphy sign
noted by sonographer.

Common bile duct:

Diameter: 7 mm, within normal limits for age.

Liver:

No focal lesion identified. Within normal limits in parenchymal
echogenicity. Portal vein is patent on color Doppler imaging with
normal direction of blood flow towards the liver.
IMPRESSION: Physiologically distended gallbladder with a few gallstones noted
near the neck. Trace fluid in the gallbladder fossa is seen which
can be a normal finding. There is no sonographic Rueda. Findings
are equivocal for acute cholecystitis. HIDA scan if clinically
necessary.

## 2020-02-26 IMAGING — XA DG CHOLANGIOGRAM OPERATIVE
5 series · 11 of 11 positions shown · non-contrast
Comparison: Right upper quadrant abdominal ultrasound - 07/20/2017

CLINICAL DATA: Intraoperative cholangiogram during laparoscopic
cholecystectomy.

EXAM:
INTRAOPERATIVE CHOLANGIOGRAM
FLUOROSCOPY TIME:  1.6 mGy

[Series 1: cont. · 1 of 1 slices shown (1 of 5)]
[im 1/1]
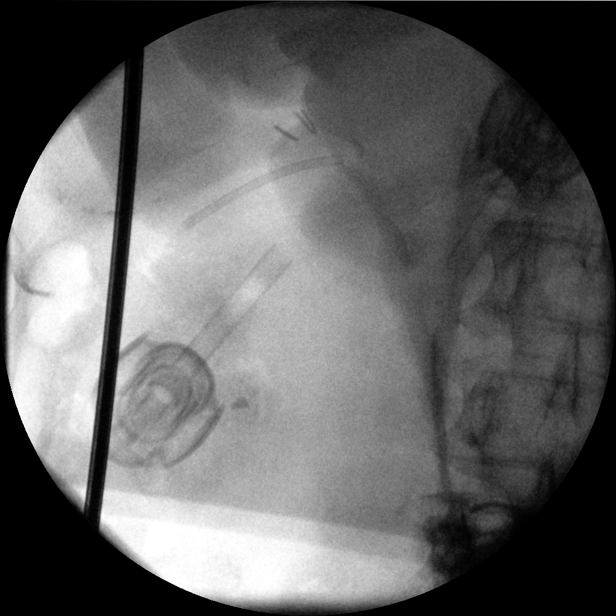

[Series 2: cont. · 3 of 9 frames shown (2 of 5)]
[frame 2/9]
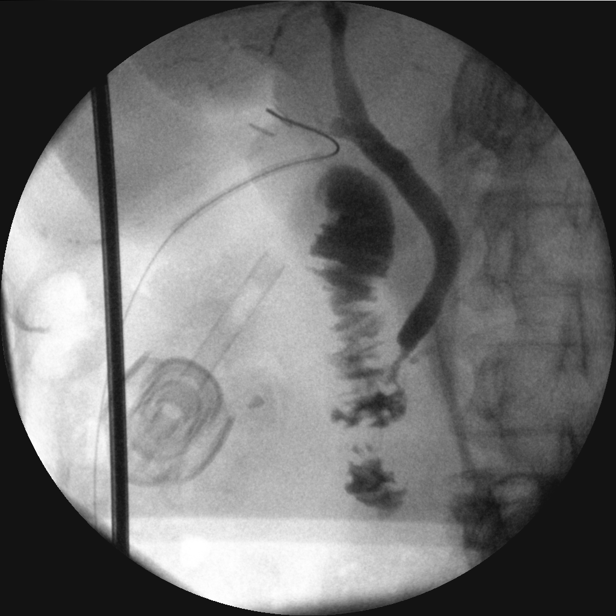
[frame 5/9]
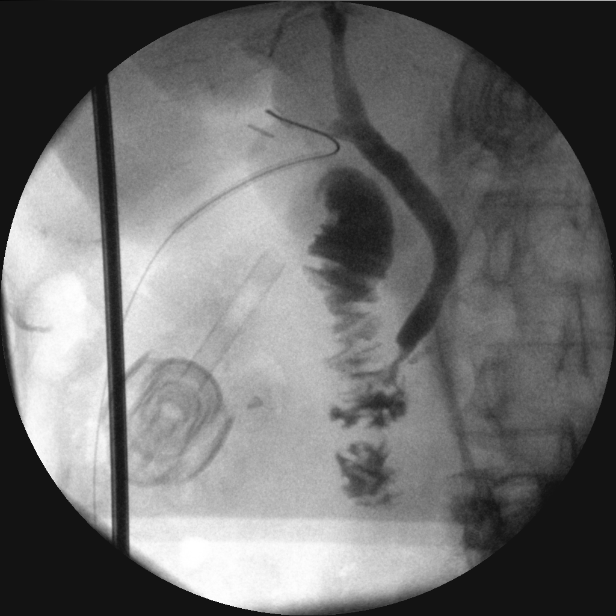
[frame 8/9]
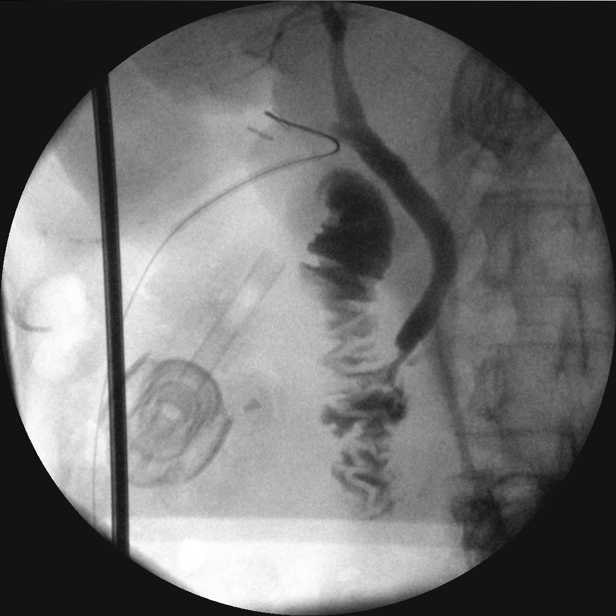

[Series 3: cont. · 1 of 1 slices shown (3 of 5)]
[im 1/1]
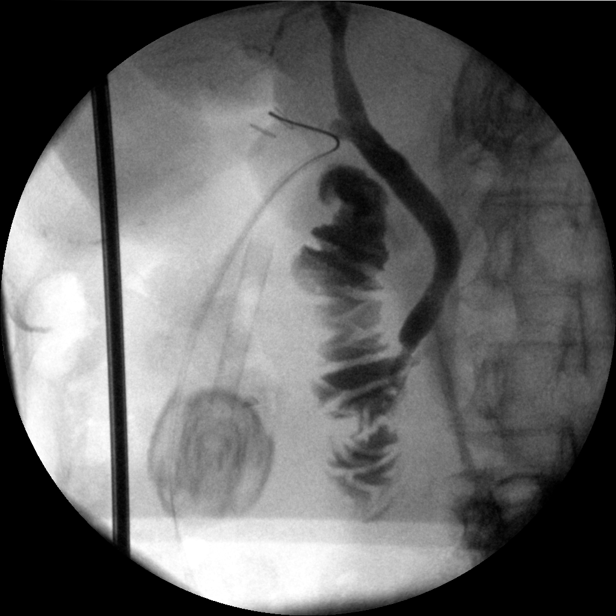

[Series 4: cont. · 3 of 7 frames shown (4 of 5)]
[frame 2/7]
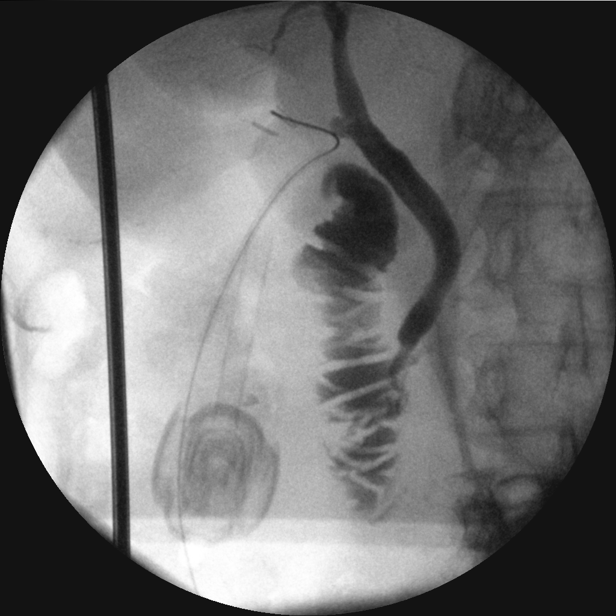
[frame 4/7]
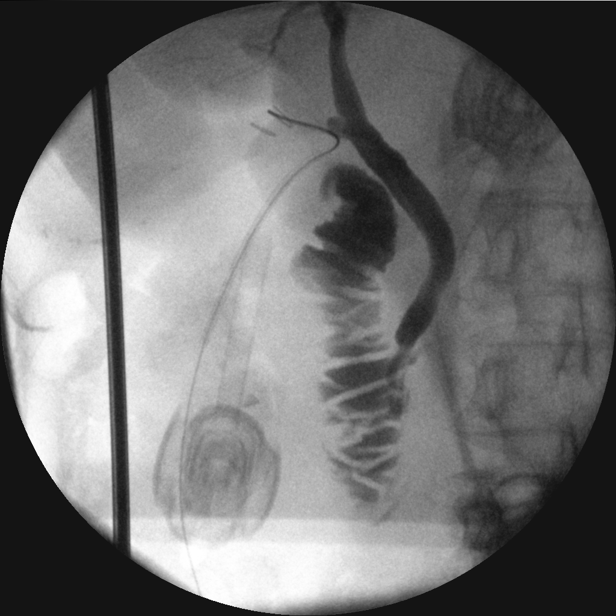
[frame 6/7]
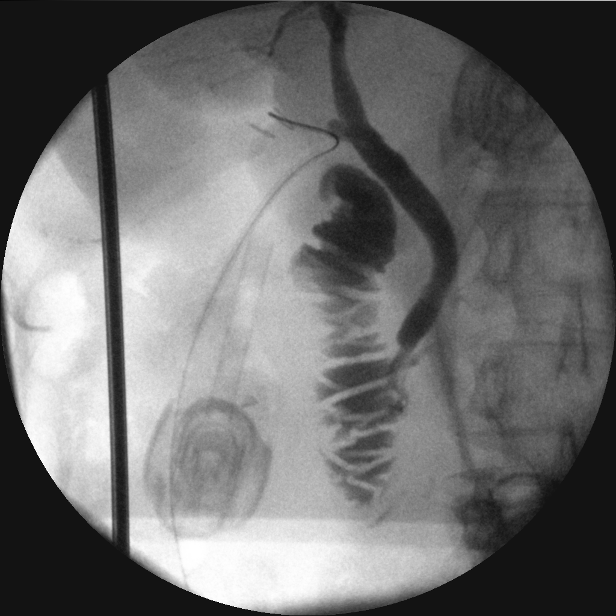

[Series 5: cont. · 3 of 12 frames shown (5 of 5)]
[frame 2/12]
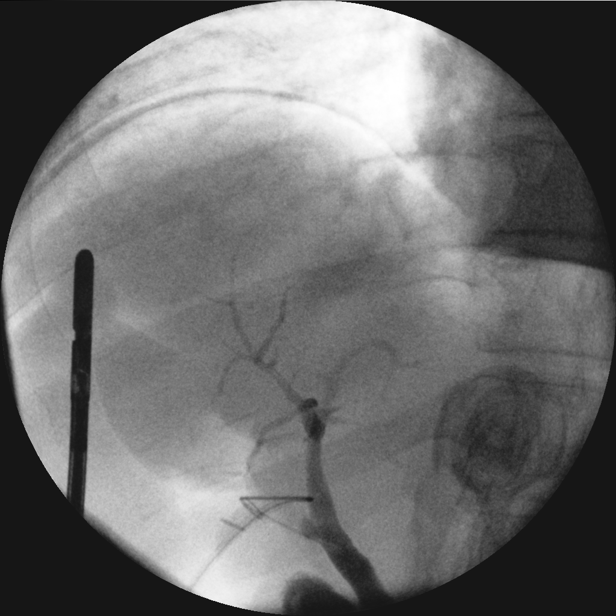
[frame 7/12]
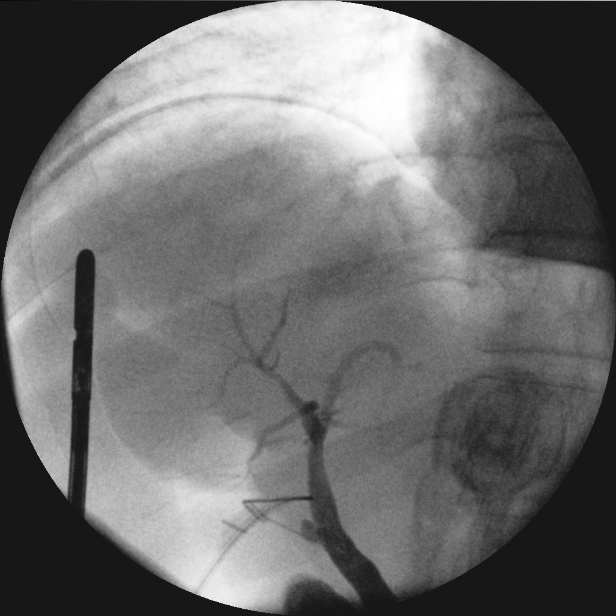
[frame 11/12]
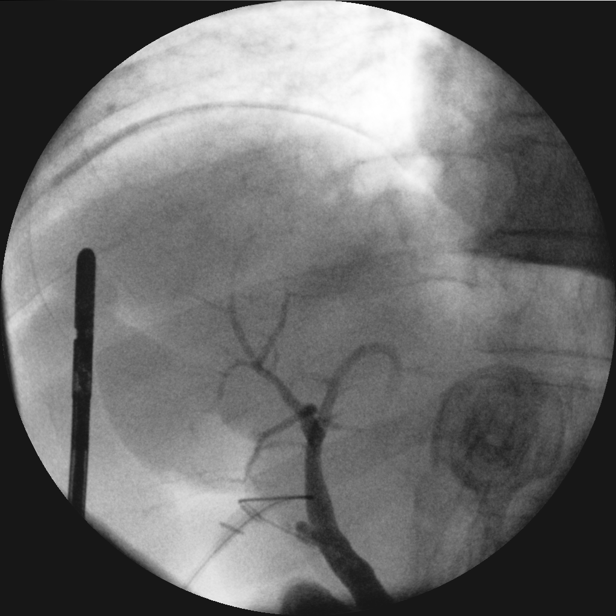

[11 of 11 positions shown; findings below may reference images not displayed]

FINDINGS: Five spot intraoperative cholangiographic images of the right upper
abdominal quadrant during laparoscopic cholecystectomy are provided
for review.

Surgical clips overlie the expected location of the gallbladder
fossa.

Contrast injection demonstrates selective cannulation of the central
aspect of the cystic duct.

There is passage of contrast through the central aspect of the
cystic duct with filling of a non dilated common bile duct. There is
passage of contrast though the CBD and into the descending portion
of the duodenum.

There is minimal reflux of injected contrast into the common hepatic
duct and central aspect of the non dilated intrahepatic biliary
system.

There are no discrete filling defects within the opacified portions
of the biliary system to suggest the presence of
choledocholithiasis.
IMPRESSION: No evidence of choledocholithiasis.
